# Patient Record
Sex: Female | Born: 1995 | Race: White | Hispanic: No | Marital: Single | Smoking: Former smoker
Health system: Southern US, Community
[De-identification: ages and names within clinical notes are randomized; demographics above are authoritative.]

## PROBLEM LIST (undated history)

## (undated) DIAGNOSIS — R55 Syncope and collapse: Secondary | ICD-10-CM

## (undated) DIAGNOSIS — R002 Palpitations: Secondary | ICD-10-CM

## (undated) DIAGNOSIS — R011 Cardiac murmur, unspecified: Secondary | ICD-10-CM

---
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
## 2015-04-10 ENCOUNTER — Encounter (HOSPITAL_COMMUNITY): Payer: Self-pay | Admitting: Emergency Medicine

## 2015-04-10 ENCOUNTER — Emergency Department (HOSPITAL_COMMUNITY)
Admission: EM | Admit: 2015-04-10 | Discharge: 2015-04-10 | Disposition: A | Discharge status: Home / Self Care | Payer: Self-pay | Attending: Emergency Medicine | Admitting: Emergency Medicine

## 2015-04-10 DIAGNOSIS — R55 Syncope and collapse: Secondary | ICD-10-CM | POA: Insufficient documentation

## 2015-04-10 DIAGNOSIS — R079 Chest pain, unspecified: Secondary | ICD-10-CM | POA: Insufficient documentation

## 2015-04-10 DIAGNOSIS — Z87891 Personal history of nicotine dependence: Secondary | ICD-10-CM | POA: Insufficient documentation

## 2015-04-10 DIAGNOSIS — R011 Cardiac murmur, unspecified: Secondary | ICD-10-CM | POA: Insufficient documentation

## 2015-04-10 DIAGNOSIS — R002 Palpitations: Secondary | ICD-10-CM | POA: Insufficient documentation

## 2015-04-10 HISTORY — DX: Palpitations: R00.2

## 2015-04-10 HISTORY — DX: Cardiac murmur, unspecified: R01.1

## 2015-04-10 HISTORY — DX: Syncope and collapse: R55

## 2015-04-10 LAB — BASIC METABOLIC PANEL
ANION GAP: 6 (ref 5–15)
BUN: 13 mg/dL (ref 6–20)
CHLORIDE: 105 mmol/L (ref 101–111)
CO2: 25 mmol/L (ref 22–32)
Calcium: 9 mg/dL (ref 8.9–10.3)
Creatinine, Ser: 0.58 mg/dL (ref 0.44–1.00)
GFR calc Af Amer: >60 mL/min (ref >60)
GFR calc non Af Amer: >60 mL/min (ref >60)
Glucose, Bld: 74 mg/dL (ref 65–99)
Potassium: 3.9 mmol/L (ref 3.5–5.1)
Sodium: 136 mmol/L (ref 135–145)

## 2015-04-10 LAB — CBC
HEMATOCRIT: 37.8 % (ref 36.0–46.0)
Hemoglobin: 12.3 g/dL (ref 12.0–15.0)
MCH: 29.5 pg (ref 26.0–34.0)
MCHC: 32.5 g/dL (ref 30.0–36.0)
MCV: 90.6 fL (ref 78.0–100.0)
Platelets: 212 10*3/uL (ref 150–400)
RBC: 4.17 MIL/uL (ref 3.87–5.11)
RDW: 13.3 % (ref 11.5–15.5)
WBC: 7.3 10*3/uL (ref 4.0–10.5)

## 2015-04-10 LAB — I-STAT TROPONIN, ED: TROPONIN I, POC: 0.01 ng/mL (ref 0.00–0.08)

## 2015-04-10 LAB — TSH: TSH: 0.75 u[IU]/mL (ref 0.350–4.500)

## 2015-04-10 NOTE — ED Provider Notes (Signed)
CSN: [id number]     Arrival date & time 04/10/15  1818 History   First MD Initiated Contact with Patient 04/10/15 1958     Chief Complaint  Patient presents with  . Dizziness  . Chest Pain     (Consider location/radiation/quality/duration/timing/severity/associated sxs/prior Treatment) HPI Comments: The pt is a 19 y/o female - she is otherwise healthy - has long time history of intermittent palpitations that occur 3X / year on average and are usually accompanied by a near syncopal episode - today was the same - she was at work as a Conservation officer, naturecashier and as she went to help someone check out she felt palpitations followed by a feeling of significant light headedness, this was persistent, and then resolved after a couple of minutes - she felt flushed in the face but was not diaphoretic, had no CP and no abd pain , no swelling.  She has not started any new medications, she thinks she may have had some recent unexpected weight loss but has had normal appetite and no diarrhea, dysuria, unusual bleeding, headaches, blurred vision, balance problems, numbness or weakness.  She has hx of seeing a doctor when she was younger b/c of chronic CP and palpitations - no definite diagnosis was made.  Patient is a 19 y.o. female presenting with dizziness and chest pain. The history is provided by the patient and a relative.  Dizziness Associated symptoms: chest pain   Chest Pain Associated symptoms: dizziness     Past Medical History  Diagnosis Date  . Heart murmur   . Near syncope   . Palpitations    History reviewed. No pertinent past surgical history. History reviewed. No pertinent family history. History  Substance Use Topics  . Smoking status: Former Smoker    Types: Cigarettes  . Smokeless tobacco: Not on file  . Alcohol Use: No   OB History    No data available     Review of Systems  Cardiovascular: Positive for chest pain.  Neurological: Positive for dizziness.  All other systems reviewed and  are negative.     Allergies  Review of patient's allergies indicates no known allergies.  Home Medications   Prior to Admission medications   Not on File   BP 134/70 mmHg  Pulse 78  Temp(Src) 99.3 F (37.4 C) (Oral)  Resp 18  SpO2 100% Physical Exam  Constitutional: She appears well-developed and well-nourished. No distress.  HENT:  Head: Normocephalic and atraumatic.  Mouth/Throat: Oropharynx is clear and moist. No oropharyngeal exudate.  Eyes: Conjunctivae and EOM are normal. Pupils are equal, round, and reactive to light. Right eye exhibits no discharge. Left eye exhibits no discharge. No scleral icterus.  Neck: Normal range of motion. Neck supple. No JVD present. No thyromegaly present.  Cardiovascular: Normal rate, regular rhythm, normal heart sounds and intact distal pulses.  Exam reveals no gallop and no friction rub.   No murmur heard. Pulmonary/Chest: Effort normal and breath sounds normal. No respiratory distress. She has no wheezes. She has no rales.  Abdominal: Soft. Bowel sounds are normal. She exhibits no distension and no mass. There is no tenderness.  Musculoskeletal: Normal range of motion. She exhibits no edema or tenderness.  Lymphadenopathy:    She has no cervical adenopathy.  Neurological: She is alert. Coordination normal.  Skin: Skin is warm and dry. No rash noted. No erythema.  Psychiatric: She has a normal mood and affect. Her behavior is normal.  Nursing note and vitals reviewed.   ED Course  Procedures (including critical care time) Labs Review Labs Reviewed  CBC  BASIC METABOLIC PANEL  TSH  I-STAT TROPOININ, ED    Imaging Review No results found.   EKG Interpretation   Date/Time:  Thursday April 10 2015 18:32:36 EDT Ventricular Rate:  70 PR Interval:  136 QRS Duration: 97 QT Interval:  382 QTC Calculation: 412 R Axis:   63 Text Interpretation:  Ectopic atrial rhythm RSR' in V1 or V2, right VCD or  RVH Borderline T  abnormalities, diffuse leads Abnormal ekg No old tracing  to compare Confirmed by Brylee Mcgreal  MD, Momo Braun ([id number]) on 04/10/2015 7:59:33 PM      MDM   Final diagnoses:  Palpitations  Near syncope    Normal exam, no distress, no arrhythmia, ECG with ectopic atrial rhythmi, no old to compare, check labs ad TSH, monitor.  Labs normal, ECG with ectopic atrial rhythm, pt informed of results - stable for d/c.  Cardiology referral given.  Eber Hong, MD 04/10/15 2137

## 2015-04-10 NOTE — ED Notes (Signed)
Pt presents to ED c/o previous near-syncopal episode at about 3pm this afternoon while at work that was accompanied by a "fluttering" sensation in her heart.  She is unsure whether or not she actually lost consciousness, but she does report that she felt very hot and went into "kind of a trance" and while she was able to see and recall what was happening, she could not respond. Pt then reported to the MD that she could not see what was happening. She states that since she was very young, she can recall having episodes of palpitations but has only been diagnosed with a heart murmur.  She does not have a documented history of syncope, seizures, or any cardiac conditions. Pt denies current pain, nausea, vomiting, dizziness, palpitations, or other symptoms.  She states that she is currently hoping to be evaluated and possibly referred to neurology or cardiology.

## 2015-04-10 NOTE — ED Notes (Signed)
Pt c/o the sensation of intermittent heart palpitation and intermittent dizziness since she was 19 years old, chest tightness x 2 years. Pt states that today she felt dizzy today, pt managed to grab onto something and not have syncopal episode and not fall. Pt denies any new problems, states that she does not have medical insurance and needs to be evaluated.

## 2015-06-11 ENCOUNTER — Encounter: Payer: Self-pay | Admitting: Cardiology

## 2015-06-11 ENCOUNTER — Ambulatory Visit (INDEPENDENT_AMBULATORY_CARE_PROVIDER_SITE_OTHER): Payer: Self-pay | Admitting: Cardiology

## 2015-06-11 ENCOUNTER — Encounter: Payer: Self-pay | Admitting: Cardiovascular Disease

## 2015-06-11 VITALS — BP 112/62 | HR 62 | Ht 67.0 in | Wt 132.4 lb

## 2015-06-11 DIAGNOSIS — I951 Orthostatic hypotension: Secondary | ICD-10-CM

## 2015-06-11 DIAGNOSIS — R002 Palpitations: Secondary | ICD-10-CM

## 2015-06-11 DIAGNOSIS — R011 Cardiac murmur, unspecified: Secondary | ICD-10-CM

## 2015-06-11 DIAGNOSIS — R55 Syncope and collapse: Secondary | ICD-10-CM

## 2015-06-11 NOTE — Patient Instructions (Signed)
DRINK ABOUT 12 TO 14  (8 oz of water a day) your urine should be clear when you go to the bathroom.   SCHEDULE WITH DR [facility] physician has recommended that you have a tilt table test. This test is sometimes used to help determine the cause of fainting spells. You lie on a table that moves from a lying down to an upright position. The change in position can bring on loss of consciousness. The doctor monitors your symptoms, heart rate, EKG, and blood pressure throughout the test. The doctor also may give you a medicine and then monitor your response to the medicine. This is done in the hospital and usually takes half of a day to complete the procedure. Please see the instruction sheet given to you today for more information.  SCHEDULE AT [address]  [address] -  [facility] OFFICEYour physician has requested that you have an echocardiogram. Echocardiography is a painless test that uses sound waves to create images of your heart. It provides your doctor with information about the size and shape of your heart and how well your heart's chambers and valves are working. This procedure takes approximately one hour. There are no restrictions for this procedure.  [employer] WILL MAIL YOU A MONITOR . YOU WILL WEAR IT FOR 30 DAYS AND THEN RETURN TO [employer]. Your physician has recommended that you wear an event monitor. Event monitors are medical devices that record the heart's electrical activity. Doctors most often [place] these monitors to diagnose arrhythmias. Arrhythmias are problems with the speed or rhythm of the heartbeat. The monitor is a small, portable device. You can wear one while you do your normal daily activities. This is usually used to diagnose what is causing palpitations/syncope (passing out).  Your physician recommends that you schedule a follow-up appointment in 2 MONTHS WITH DR HARDING -30 MIN APPOINTMENT.   Cardiac Event Monitoring A cardiac event monitor is a small recording  device used to help detect abnormal heart rhythms (arrhythmias). The monitor is used to record heart rhythm when noticeable symptoms such as the following occur:  Fast heartbeats (palpitations), such as heart racing or fluttering.  Dizziness.  Fainting or light-headedness.  Unexplained weakness. The monitor is wired to two electrodes placed on your chest. Electrodes are flat, sticky disks that attach to your skin. The monitor can be worn for up to 30 days. You will wear the monitor at all times, except when bathing.  HOW TO USE YOUR CARDIAC EVENT MONITOR A technician will prepare your chest for the electrode placement. The technician will show you how to place the electrodes, how to work the monitor, and how to replace the batteries. Take time to practice using the monitor before you leave the office. Make sure you understand how to send the information from the monitor to your health care provider. This requires a telephone with a landline, not a cell phone. You need to:  Wear your monitor at all times, except when you are in water:  Do not get the monitor wet.  Take the monitor off when bathing. Do not swim or use a hot tub with it on.  Keep your skin clean. Do not put body lotion or moisturizer on your chest.  Change the electrodes daily or any time they stop sticking to your skin. You might need to use tape to keep them on.  It is possible that your skin under the electrodes could become irritated. To keep this from happening, try to put  the electrodes in slightly different places on your chest. However, they must remain in the area under your left breast and in the upper right section of your chest.  Make sure the monitor is safely clipped to your clothing or in a location close to your body that your health care provider recommends.  Press the button to record when you feel symptoms of heart trouble, such as dizziness, weakness, light-headedness, palpitations, thumping, shortness of  breath, unexplained weakness, or a fluttering or racing heart. The monitor is always on and records what happened slightly before you pressed the button, so do not worry about being too late to get good information.  Keep a diary of your activities, such as walking, doing chores, and taking medicine. It is especially important to note what you were doing when you pushed the button to record your symptoms. This will help your health care provider determine what might be contributing to your symptoms. The information stored in your monitor will be reviewed by your health care provider alongside your diary entries.  Send the recorded information as recommended by your health care provider. It is important to understand that it will take some time for your health care provider to process the results.  Change the batteries as recommended by your health care provider. SEEK IMMEDIATE MEDICAL CARE IF:   You have chest pain.  You have extreme difficulty breathing or shortness of breath.  You develop a very fast heartbeat that persists.  You develop dizziness that does not go away.  You faint or constantly feel you are about to faint. Document Released: 08/03/2008 Document Revised: 03/11/2014 Document Reviewed: 04/23/2013 [facility] Patient Information 2015 [place], [place]. This information is not intended to replace advice given to you by your health care provider. Make sure you discuss any questions you have with your health care provider.

## 2015-06-13 DIAGNOSIS — R55 Syncope and collapse: Secondary | ICD-10-CM | POA: Insufficient documentation

## 2015-06-13 DIAGNOSIS — R002 Palpitations: Secondary | ICD-10-CM | POA: Insufficient documentation

## 2015-06-13 DIAGNOSIS — I951 Orthostatic hypotension: Secondary | ICD-10-CM | POA: Insufficient documentation

## 2015-06-13 DIAGNOSIS — R011 Cardiac murmur, unspecified: Secondary | ICD-10-CM | POA: Insufficient documentation

## 2015-06-13 NOTE — Progress Notes (Signed)
PATIENT: Misty Guerra MRN: [id number] DOB: Mar 05, 1996 PCP: No PCP Per Patient  Clinic Note: Chief Complaint  Patient presents with  . New Evaluation     chest pain-tightness, no  shortness of breath, no edema, has frequent pain in legs a lot, frequently has cramping in legs, frequent lightheadedness, frequent dizziness    HPI: Misty Guerra is a 19 y.o. female with a PMH below who presents today for evaluation of syncopal episodes and palpitations.  Interval History: She is a very anxious appearing young woman who was referred here in follow-up for recent ER visit following an syncopal episode. She has had a history of passing out spells before as a teenager. Initially these were thought to potentially be seizures. Tells a history of having passed out spells at the site of blockage or pain or stressful situations.  Her recent episode is actually a recurrence of other episodes of syncope that all seemed to have gotten worse ever since she has been trying to stabilize her relationship with her child's father. The episode that caused her to be referred here to me occurred on June 2. Is apparently her third time this past few months. 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 She began to her and get lightheaded and clammy. Although she may pass out but did not fully pass out. She then had another episode where she actually did apparently pass out.  She otherwise denies any significant significant rapid irregular heartbeats. Feels a tightness in her chest and some dyspnea while she feels that she is about to pass out but otherwise does not. None of this is exertional in nature. She does not drink a significant amount of caffeine but also does not drink a significant amount of hydration with water or non-caffeinated beverages. She describes an episode at 19 years old passing out. Then one time was when she had a tooth pulled and another time when she  had a small surgical procedure done.  The remainder of cardiac review of systems is as follows: Cardiovascular ROS: positive for - loss of consciousness and Associated with palpitations and dyspnea as well as some chest discomfort negative for - edema, murmur, orthopnea, paroxysmal nocturnal dyspnea, shortness of breath or TIA/amaurosis fugax :   Past Medical History  Diagnosis Date  . Heart murmur   . Near syncope   . Palpitations     Prior Cardiac Evaluation and Past Surgical History: No past surgical history on file.  No Known Allergies  No current outpatient prescriptions on file.   No current facility-administered medications for this visit.    History  Substance Use Topics  . Smoking status: Former Smoker    Types: Cigarettes  . Smokeless tobacco: Not on file  . Alcohol Use: No    family history includes Depression in her mother; Heart attack in her paternal grandfather.  ROS: A comprehensive Review of Systems - was performed Review of Systems  Constitutional: Positive for diaphoresis (With her near syncopal events).  Respiratory: Positive for shortness of breath (Only with near syncope or syncope). Negative for cough.   Cardiovascular: Positive for palpitations. Negative for claudication and leg swelling.  Gastrointestinal: Negative for blood in stool and melena.  Genitourinary: Negative for hematuria.  Musculoskeletal:       Bilateral leg pains frequently with cramping.  Neurological: Positive for dizziness and loss of consciousness. Negative for weakness.  Endo/Heme/Allergies: Does not bruise/bleed easily.  Psychiatric/Behavioral: Positive for  depression. The patient is nervous/anxious.        Very tearful  All other systems reviewed and are negative.   PHYSICAL EXAM BP 112/62 mmHg  Pulse 62  Ht 5\' 7"  (1.702 m)  Wt 132 lb 6 oz (60.045 kg)  BMI 20.73 kg/m2  LMP 05/20/2015 General appearance: alert, cooperative, appears stated age, no distress and  Healthy-appearing. Well-nourished and well-groomed Neck: no adenopathy, no carotid bruit, no JVD, supple, symmetrical, trachea midline and thyroid not enlarged, symmetric, no tenderness/mass/nodules Lungs: clear to auscultation bilaterally, normal percussion bilaterally and Nonlabored, good air movement Heart: RRR with normal S1 and S2. Cannot exclude soft midsystolic click. Otherwise no M/R/G. Nondisplaced PMI. Abdomen: soft, non-tender; bowel sounds normal; no masses,  no organomegaly Extremities: extremities normal, atraumatic, no cyanosis or edema and No acrocyanosis noted with wall lean Pulses: 2+ and symmetric Skin: Skin color, texture, turgor normal. No rashes or lesions or Several tattoos Neurologic: Mental status: Alert, oriented, thought content appropriate, affect: labile Cranial nerves: normal   Adult ECG Report  Rate: 62 ;  Rhythm: normal sinus rhythm and Nonspecific ST-T wave changes with T-wave inversions in lead 3 and aVF as well as V3.  Narrative Interpretation: Abnormal EKG with nonspecific T-wave changes.  Recent Labs:    Chemistry      Component Value Date/Time   NA 136 04/10/2015 1953   K 3.9 04/10/2015 1953   CL 105 04/10/2015 1953   CO2 25 04/10/2015 1953   BUN 13 04/10/2015 1953   CREATININE 0.58 04/10/2015 1953      Component Value Date/Time   CALCIUM 9.0 04/10/2015 1953      ASSESSMENT / PLAN: Very pleasant young woman with a history of syncope and frequent near syncope. She has some symptoms of orthostatic hypotension and dizziness but also symptoms to suggest vasovagal syncope. There is also possible slight cardiac murmur versus an systolic click. Cannot exclude mitral prolapse given her palpitations. Cannot exclude arrhythmia since her symptoms are often associated with palpitations.  In order to clarify presence or absence of any structural other maladies we will check an echocardiogram. As a strongly believe her symptoms are related to vasovagal  syncope, but also potentially related to orthostatics, I would order a tilt table test. To evaluate for any arrhythmias or cardiac event monitor - I have asked that she continue with her general routine life activities while wearing the monitor.  More likely of the symptoms are probably related to increased levels of social stress and anxiety. I think if there is a vasovagal or neurocardiogenic component to her syncope, I think SSRI may be a very good option for her and also treat her anxiety disorder.  Problem List Items Addressed This Visit    None    Visit Diagnoses    Syncope, unspecified syncope type    -  Primary    Relevant Orders    EKG 12-Lead (Completed)    TILT TABLE STUDY    ECHOCARDIOGRAM COMPLETE    Cardiac event monitor    Orthostatic hypotension        Relevant Orders    EKG 12-Lead (Completed)    TILT TABLE STUDY    ECHOCARDIOGRAM COMPLETE    Cardiac event monitor    Cardiac murmur        Relevant Orders    EKG 12-Lead (Completed)    TILT TABLE STUDY    ECHOCARDIOGRAM COMPLETE    Cardiac event monitor    Palpitations  Relevant Orders    EKG 12-Lead (Completed)    TILT TABLE STUDY    ECHOCARDIOGRAM COMPLETE    Cardiac event monitor       No orders of the defined types were placed in this encounter.    Followup: 2 months  Dellis Voght W. Herbie [place], M.D., M.S. Interventional Cardiolgy [facility]

## 2015-06-19 ENCOUNTER — Telehealth: Payer: Self-pay | Admitting: Cardiology

## 2015-06-19 NOTE — Telephone Encounter (Signed)
[employer] called - they have no insurance on file for patient - attempted to contact patient to offer self-pay - she states phone number inactive  I attempted to reach patient - number we have on file is same [employer] has - did not work for me either.  Routing to [place] for additional advice

## 2015-06-23 ENCOUNTER — Other Ambulatory Visit: Payer: Self-pay | Admitting: *Deleted

## 2015-06-23 DIAGNOSIS — Z01818 Encounter for other preprocedural examination: Secondary | ICD-10-CM

## 2015-07-01 ENCOUNTER — Telehealth: Payer: Self-pay | Admitting: Cardiology

## 2015-07-01 ENCOUNTER — Other Ambulatory Visit (HOSPITAL_COMMUNITY): Payer: Self-pay

## 2015-07-01 NOTE — Telephone Encounter (Signed)
Patient was a no show for echo which was schedule for 7:30 on today.  Patient no showed for the test and was unable to be reach by phone because the number was not working.  Ms. Giacomo was  to have a 30 day event monitor today and the appointment was cancel due she is self pay.  She was going  to received the application for [employer] financial hardship program at her appointment.

## 2015-07-05 ENCOUNTER — Encounter (HOSPITAL_COMMUNITY): Payer: Self-pay | Admitting: Anesthesiology

## 2015-07-08 ENCOUNTER — Ambulatory Visit (HOSPITAL_COMMUNITY): Admission: RE | Admit: 2015-07-08 | Payer: Self-pay | Source: Ambulatory Visit | Admitting: Cardiovascular Disease

## 2015-07-08 ENCOUNTER — Encounter (HOSPITAL_COMMUNITY): Admission: RE | Payer: Self-pay | Source: Ambulatory Visit

## 2015-07-08 SURGERY — TILT TABLE STUDY

## 2015-07-17 ENCOUNTER — Telehealth: Payer: Self-pay | Admitting: Cardiology

## 2015-07-17 NOTE — Telephone Encounter (Signed)
Pt says she been waiting for a monitor for over a month,she still have not received it.

## 2015-07-17 NOTE — Telephone Encounter (Signed)
Left message for patient to call back. I had attempted to contact patient 1 month ago following an attempt by [employer] representatives to get in touch with patient. Initially, she was sent home w/ [employer] monitoring - unsure what happened to this, at time, patient was self-pay, she also did not have an operating phone number or confirmed home address. Since then, appears she was a no show for an echo - would have had a [employer] monitor set up at that time. She still may need to submit application to [employer] for self-pay assistance - will need to confirm this upon callback.

## 2015-07-21 NOTE — Telephone Encounter (Signed)
Pt returned call. Supposed to be set up for [employer] recently (same day as echo). She was no-show for echo - needs to be rescheduled. Also needs [employer] self-pay assistance. Instructed that we will get echo scheduled, have her come in and fill out form for [employer] when she comes to [facility].  Messages sent to [facility] for echo scheduling, and to Andee Lineman for monitor scheduling.

## 2015-08-25 ENCOUNTER — Ambulatory Visit: Payer: Self-pay | Admitting: Cardiology

## 2015-08-27 ENCOUNTER — Encounter: Payer: Self-pay | Admitting: Cardiology

## 2015-08-27 NOTE — Progress Notes (Signed)
  ROS  This encounter was created in error - please disregard. 

## 2015-08-31 ENCOUNTER — Emergency Department (HOSPITAL_COMMUNITY): Payer: Self-pay

## 2015-08-31 ENCOUNTER — Encounter (HOSPITAL_COMMUNITY): Payer: Self-pay | Admitting: Nurse Practitioner

## 2015-08-31 ENCOUNTER — Other Ambulatory Visit: Payer: Self-pay

## 2015-08-31 ENCOUNTER — Emergency Department (HOSPITAL_COMMUNITY)
Admission: EM | Admit: 2015-08-31 | Discharge: 2015-08-31 | Disposition: A | Discharge status: Home / Self Care | Payer: Self-pay | Attending: Emergency Medicine | Admitting: Emergency Medicine

## 2015-08-31 DIAGNOSIS — R0789 Other chest pain: Secondary | ICD-10-CM | POA: Insufficient documentation

## 2015-08-31 DIAGNOSIS — F419 Anxiety disorder, unspecified: Secondary | ICD-10-CM | POA: Insufficient documentation

## 2015-08-31 DIAGNOSIS — G8929 Other chronic pain: Secondary | ICD-10-CM | POA: Insufficient documentation

## 2015-08-31 DIAGNOSIS — Z87891 Personal history of nicotine dependence: Secondary | ICD-10-CM | POA: Insufficient documentation

## 2015-08-31 DIAGNOSIS — R5383 Other fatigue: Secondary | ICD-10-CM | POA: Insufficient documentation

## 2015-08-31 DIAGNOSIS — Z3202 Encounter for pregnancy test, result negative: Secondary | ICD-10-CM | POA: Insufficient documentation

## 2015-08-31 DIAGNOSIS — R5382 Chronic fatigue, unspecified: Secondary | ICD-10-CM

## 2015-08-31 DIAGNOSIS — R011 Cardiac murmur, unspecified: Secondary | ICD-10-CM | POA: Insufficient documentation

## 2015-08-31 LAB — BASIC METABOLIC PANEL
Anion gap: 5 (ref 5–15)
BUN: 11 mg/dL (ref 6–20)
CHLORIDE: 106 mmol/L (ref 101–111)
CO2: 25 mmol/L (ref 22–32)
Calcium: 8.6 mg/dL — ABNORMAL LOW (ref 8.9–10.3)
Creatinine, Ser: 0.65 mg/dL (ref 0.44–1.00)
GFR calc Af Amer: >60 mL/min (ref >60)
GFR calc non Af Amer: >60 mL/min (ref >60)
Glucose, Bld: 91 mg/dL (ref 65–99)
POTASSIUM: 3.8 mmol/L (ref 3.5–5.1)
SODIUM: 136 mmol/L (ref 135–145)

## 2015-08-31 LAB — CBC WITH DIFFERENTIAL/PLATELET
BASOS PCT: 0 %
Basophils Absolute: 0 10*3/uL (ref 0.0–0.1)
Eosinophils Absolute: 0.2 10*3/uL (ref 0.0–0.7)
Eosinophils Relative: 4 %
HCT: 35.1 % — ABNORMAL LOW (ref 36.0–46.0)
Hemoglobin: 11.5 g/dL — ABNORMAL LOW (ref 12.0–15.0)
LYMPHS PCT: 45 %
Lymphs Abs: 2.4 10*3/uL (ref 0.7–4.0)
MCH: 30.1 pg (ref 26.0–34.0)
MCHC: 32.8 g/dL (ref 30.0–36.0)
MCV: 91.9 fL (ref 78.0–100.0)
Monocytes Absolute: 0.5 10*3/uL (ref 0.1–1.0)
Monocytes Relative: 9 %
NEUTROS PCT: 42 %
Neutro Abs: 2.3 10*3/uL (ref 1.7–7.7)
Platelets: 200 10*3/uL (ref 150–400)
RBC: 3.82 MIL/uL — AB (ref 3.87–5.11)
RDW: 13.9 % (ref 11.5–15.5)
WBC: 5.4 10*3/uL (ref 4.0–10.5)

## 2015-08-31 LAB — I-STAT BETA HCG BLOOD, ED (MC, WL, AP ONLY): I-stat hCG, quantitative: <5 m[IU]/mL (ref <5)

## 2015-08-31 LAB — I-STAT TROPONIN, ED: TROPONIN I, POC: 0.01 ng/mL (ref 0.00–0.08)

## 2015-08-31 LAB — I-STAT CG4 LACTIC ACID, ED: Lactic Acid, Venous: 0.57 mmol/L (ref 0.5–2.0)

## 2015-08-31 MED ORDER — ASPIRIN 325 MG PO TABS
325.0000 mg | ORAL_TABLET | Freq: Once | ORAL | Status: AC
  Administered 2015-08-31: 325 mg via ORAL
  Filled 2015-08-31: qty 1

## 2015-08-31 MED ORDER — SODIUM CHLORIDE 0.9 % IV BOLUS (SEPSIS)
1000.0000 mL | Freq: Once | INTRAVENOUS | Status: AC
  Administered 2015-08-31: 1000 mL via INTRAVENOUS

## 2015-08-31 NOTE — ED Provider Notes (Signed)
CSN: [id number]     Arrival date & time 08/31/15  1643 History   First MD Initiated Contact with Patient 08/31/15 1653     Chief Complaint  Patient presents with  . Chest Pain  . Weakness     (Consider location/radiation/quality/duration/timing/severity/associated sxs/prior Treatment) HPI Comments: Misty Guerra is a 19 y.o. female with a PMHx of heart murmur, nearsyncope, chronic chest pain, and palpitations, who presents to the ED with complaints of chest tightness 2-3 days and general fatigue. She reports this is the same as her recurrent chest tightness she has had in the past, she has been seen by Dr. Herbie Baltimore of cardiology who has ordered an echo and tilt table test but she has not yet done these. She describes her chest tightness as 5/10 intermittent left anterior chest tightness which is nonradiating worse with activity and with no treatments tried prior to arrival. She reports that she gets anxious when this occurs. She denies any fevers, chills, URI symptoms, cough, shortness of breath, diaphoresis, lightheadedness, leg swelling, recent travel/surgery/immobilization, OCPs, history of DVT/PE, dental pain, nausea, vomiting, diarrhea, constipation, dysuria, hematuria, numbness, tingling, weakness, claudication, or orthopnea. She is a nonsmoker. She has a family history of MI in her paternal grandfather but no other cardiac disease in her family.  Patient is a 19 y.o. female presenting with chest pain. The history is provided by the patient. No language interpreter was used.  Chest Pain Pain location:  L chest Pain quality: tightness   Pain radiates to:  Does not radiate Pain radiates to the back: no   Pain severity:  Mild Onset quality:  Gradual Duration:  3 days Timing:  Intermittent Progression:  Unchanged Chronicity:  Recurrent Context: at rest   Relieved by:  None tried Exacerbated by: activity. Ineffective treatments:  None tried Associated symptoms: anxiety and fatigue    Associated symptoms: no abdominal pain, no claudication, no cough, no diaphoresis, no fever, no lower extremity edema, no nausea, no near-syncope, no numbness, no orthopnea, no shortness of breath, no syncope, not vomiting and no weakness   Risk factors: no birth control, no coronary artery disease, no diabetes mellitus, no high cholesterol, no hypertension, no immobilization, no prior DVT/PE, no smoking and no surgery     Past Medical History  Diagnosis Date  . Heart murmur   . Near syncope   . Palpitations    No past surgical history on file. Family History  Problem Relation Age of Onset  . Depression Mother   . Heart attack Paternal Grandfather    Social History  Substance Use Topics  . Smoking status: Former Smoker    Types: Cigarettes  . Smokeless tobacco: Not on file  . Alcohol Use: No   OB History    No data available     Review of Systems  Constitutional: Positive for fatigue. Negative for fever, chills and diaphoresis.  HENT: Negative for rhinorrhea and sore throat.   Respiratory: Positive for chest tightness. Negative for cough and shortness of breath.   Cardiovascular: Positive for chest pain (tightness). Negative for orthopnea, claudication, leg swelling, syncope and near-syncope.  Gastrointestinal: Negative for nausea, vomiting, abdominal pain, diarrhea and constipation.  Genitourinary: Negative for dysuria and hematuria.  Musculoskeletal: Negative for myalgias and arthralgias.  Skin: Negative for color change.  Allergic/Immunologic: Negative for immunocompromised state.  Neurological: Negative for syncope, weakness, light-headedness and numbness.  Psychiatric/Behavioral: Negative for confusion.   10 Systems reviewed and are negative for acute change except as noted in  the HPI.    Allergies  Review of patient's allergies indicates no known allergies.  Home Medications   Prior to Admission medications   Not on File   BP 114/75 mmHg  Pulse 63   Temp(Src) 98.4 F (36.9 C) (Oral)  Resp 21  Ht 5\' 9"  (1.753 m)  Wt 140 lb (63.504 kg)  BMI 20.67 kg/m2  SpO2 100% Physical Exam  Constitutional: She is oriented to person, place, and time. Vital signs are normal. She appears well-developed and well-nourished.  Non-toxic appearance. No distress.  Afebrile, nontoxic, NAD  HENT:  Head: Normocephalic and atraumatic.  Mouth/Throat: Oropharynx is clear and moist and mucous membranes are normal.  Eyes: Conjunctivae and EOM are normal. Right eye exhibits no discharge. Left eye exhibits no discharge.  Neck: Normal range of motion. Neck supple.  Cardiovascular: Normal rate, regular rhythm, S1 normal, S2 normal and intact distal pulses.  Exam reveals no gallop and no friction rub.   Murmur heard.  Systolic murmur is present  RRR, nl s1/s2, ?midsystolic click (faint), distal pulses intact, no pedal edema   Pulmonary/Chest: Effort normal and breath sounds normal. No respiratory distress. She has no decreased breath sounds. She has no wheezes. She has no rhonchi. She has no rales.  Abdominal: Soft. Normal appearance and bowel sounds are normal. She exhibits no distension. There is no tenderness. There is no rigidity, no rebound, no guarding, no CVA tenderness, no tenderness at McBurney's point and negative Murphy's sign.  Musculoskeletal: Normal range of motion.  MAE x4 Strength and sensation grossly intact Distal pulses intact No pedal edema, neg homan's bilaterally   Neurological: She is alert and oriented to person, place, and time. She has normal strength. No cranial nerve deficit or sensory deficit. Gait normal. GCS eye subscore is 4. GCS verbal subscore is 5. GCS motor subscore is 6.  No focal neuro deficits  Skin: Skin is warm, dry and intact. No rash noted.  Psychiatric: She has a normal mood and affect.  Nursing note and vitals reviewed.   ED Course  Procedures (including critical care time) Labs Review Labs Reviewed  BASIC METABOLIC  PANEL - Abnormal; Notable for the following:    Calcium 8.6 (*)    All other components within normal limits  CBC WITH DIFFERENTIAL/PLATELET - Abnormal; Notable for the following:    RBC 3.82 (*)    Hemoglobin 11.5 (*)    HCT 35.1 (*)    All other components within normal limits  I-STAT TROPOININ, ED  I-STAT CG4 LACTIC ACID, ED  I-STAT BETA HCG BLOOD, ED ([facility], [facility], [facility] ONLY)  I-STAT BETA HCG BLOOD, ED ([facility], [facility], [facility] ONLY)    Imaging Review Dg Chest 2 View  08/31/2015  CLINICAL DATA:  Chest tightness x3 days EXAM: CHEST  2 VIEW COMPARISON:  None. FINDINGS: Lungs are clear.  No pleural effusion or pneumothorax. The heart is normal in size. Visualized osseous structures are within normal limits. IMPRESSION: Normal chest radiographs. Electronically Signed   By: Charline BillsSriyesh  Krishnan M.D.   On: 08/31/2015 17:39   I have personally reviewed and evaluated these images and lab results as part of my medical decision-making.   EKG Interpretation None      MDM   Final diagnoses:  Chest tightness  Chronic fatigue    19 y.o. female here with chest tightness and fatigue x3 days. States this is similar to prior episodes of chest tightness that she has experienced since she was a teen. Has seen Dr.  Herbie Baltimore of cardiology in August but hasn't followed up with echo or tilt table test. On exam, ?midsystolic click auscultated, chest wall TTP which could mean pain is musculoskeletal. No tachycardia, hypoxia, or LE swelling. No focal neuro findings. Will get basic labs, EKG, CXR, lactic, and give ASA and fluids. Will reassess shortly.   7:16 PM EKG unchanged from prior, showing ectopic atrial  Rhythm. CXR clear. Labs unremarkable aside from mildly low H/H similar to prior. Will have pt f/up with cardiology this week. Tylenol/motrin for pain, since ASA seemed to help which could indicate inflammatory/MsK etiology. Stay well hydrated and get plenty of rest. I explained the diagnosis and have given explicit  precautions to return to the ER including for any other new or worsening symptoms. The patient understands and accepts the medical plan as it's been dictated and I have answered their questions. Discharge instructions concerning home care and prescriptions have been given. The patient is STABLE and is discharged to home in good condition.  BP 114/75 mmHg  Pulse 63  Temp(Src) 98.4 F (36.9 C) (Oral)  Resp 21  Ht  (1.753 m)  Wt 140 lb (63.504 kg)  BMI 20.67 kg/m2  SpO2 100%  LMP 08/17/2015 (Approximate)  Meds ordered this encounter  Medications  . sodium chloride 0.9 % bolus 1,000 mL    Sig:   . aspirin tablet 325 mg    Sig:      Belinda Bringhurst Camprubi-Soms, PA-C 08/31/15 1918  Glynn Octave, MD 08/31/15 2257

## 2015-08-31 NOTE — ED Notes (Signed)
Pt c/o chest pain that she describes as tightness, general malaise and weakness, onset 3-7 days ago, chart reviews indicates recent cardiac evaluation and a heart murmurs. Denies shortness of breath or any other symptoms.

## 2015-08-31 NOTE — Discharge Instructions (Signed)
Follow up with your cardiologist this week for ongoing management of your symptoms. Stay well hydrated, get plenty of rest. Use tylenol or motrin as needed for pain. Return to the ER for changes or worsening symptoms.   Chest Wall Pain Chest wall pain is pain in or around the bones and muscles of your chest. Sometimes, an injury causes this pain. Sometimes, the cause may not be known. This pain may take several weeks or longer to get better. HOME CARE Pay attention to any changes in your symptoms. Take these actions to help with your pain:  Rest as told by your doctor.  Avoid activities that cause pain. Try not to use your chest, belly (abdominal), or side muscles to lift heavy things.  If directed, apply ice to the painful area:  Put ice in a plastic bag.  Place a towel between your skin and the bag.  Leave the ice on for 20 minutes, 2-3 times per day.  Take over-the-counter and prescription medicines only as told by your doctor.  Do not use tobacco products, including cigarettes, chewing tobacco, and e-cigarettes. If you need help quitting, ask your doctor.  Keep all follow-up visits as told by your doctor. This is important. GET HELP IF:  You have a fever.  Your chest pain gets worse.  You have new symptoms. GET HELP RIGHT AWAY IF:  You feel sick to your stomach (nauseous) or you throw up (vomit).  You feel sweaty or light-headed.  You have a cough with phlegm (sputum) or you cough up blood.  You are short of breath.   This information is not intended to replace advice given to you by your health care provider. Make sure you discuss any questions you have with your health care provider.   Document Released: 04/12/2008 Document Revised: 07/16/2015 Document Reviewed: 01/20/2015 Elsevier Interactive Patient Education 2016 [employer].   Fatigue Fatigue is feeling tired all of the time, a lack of energy, or a lack of motivation. Occasional or mild fatigue is often a  normal response to activity or life in general. However, long-lasting (chronic) or extreme fatigue may indicate an underlying medical condition. HOME CARE INSTRUCTIONS  Watch your fatigue for any changes. The following actions may help to lessen any discomfort you are feeling:  Talk to your health care provider about how much sleep you need each night. Try to get the required amount every night.  Take medicines only as directed by your health care provider.  Eat a healthy and nutritious diet. Ask your health care provider if you need help changing your diet.  Drink enough fluid to keep your urine clear or pale yellow.  Practice ways of relaxing, such as yoga, meditation, massage therapy, or acupuncture.  Exercise regularly.   Change situations that cause you stress. Try to keep your work and personal routine reasonable.  Do not abuse illegal drugs.  Limit alcohol intake to no more than 1 drink per day for nonpregnant women and 2 drinks per day for men. One drink equals 12 ounces of beer, 5 ounces of wine, or 1 ounces of hard liquor.  Take a multivitamin, if directed by your health care provider. SEEK MEDICAL CARE IF:   Your fatigue does not get better.  You have a fever.   You have unintentional weight loss or gain.  You have headaches.   You have difficulty:   Falling asleep.  Sleeping throughout the night.  You feel angry, guilty, anxious, or sad.   You  are unable to have a bowel movement (constipation).   You skin is dry.   Your legs or another part of your body is swollen.  SEEK IMMEDIATE MEDICAL CARE IF:   You feel confused.   Your vision is blurry.  You feel faint or pass out.   You have a severe headache.   You have severe abdominal, pelvic, or back pain.   You have chest pain, shortness of breath, or an irregular or fast heartbeat.   You are unable to urinate or you urinate less than normal.   You develop abnormal bleeding, such  as bleeding from the rectum, vagina, nose, lungs, or nipples.  You vomit blood.   You have thoughts about harming yourself or committing suicide.   You are worried that you might harm someone else.    This information is not intended to replace advice given to you by your health care provider. Make sure you discuss any questions you have with your health care provider.   Document Released: 08/22/2007 Document Revised: 11/15/2014 Document Reviewed: 02/26/2014 Elsevier Interactive Patient Education 2016 Elsevier Inc.  Nonspecific Chest Pain It is often hard to find the cause of chest pain. There is always a chance that your pain could be related to something serious, such as a heart attack or a blood clot in your lungs. Chest pain can also be caused by conditions that are not life-threatening. If you have chest pain, it is very important to follow up with your doctor.  HOME CARE  If you were prescribed an antibiotic medicine, finish it all even if you start to feel better.  Avoid any activities that cause chest pain.  Do not use any tobacco products, including cigarettes, chewing tobacco, or electronic cigarettes. If you need help quitting, ask your doctor.  Do not drink alcohol.  Take medicines only as told by your doctor.  Keep all follow-up visits as told by your doctor. This is important. This includes any further testing if your chest pain does not go away.  Your doctor may tell you to keep your head raised (elevated) while you sleep.  Make lifestyle changes as told by your doctor. These may include:  Getting regular exercise. Ask your doctor to suggest some activities that are safe for you.  Eating a heart-healthy diet. Your doctor or a diet specialist (dietitian) can help you to learn healthy eating options.  Maintaining a healthy weight.  Managing diabetes, if necessary.  Reducing stress. GET HELP IF:  Your chest pain does not go away, even after  treatment.  You have a rash with blisters on your chest.  You have a fever. GET HELP RIGHT AWAY IF:  Your chest pain is worse.  You have an increasing cough, or you cough up blood.  You have severe belly (abdominal) pain.  You feel extremely weak.  You pass out (faint).  You have chills.  You have sudden, unexplained chest discomfort.  You have sudden, unexplained discomfort in your arms, back, neck, or jaw.  You have shortness of breath at any time.  You suddenly start to sweat, or your skin gets clammy.  You feel nauseous.  You vomit.  You suddenly feel light-headed or dizzy.  Your heart begins to beat quickly, or it feels like it is skipping beats. These symptoms may be an emergency. Do not wait to see if the symptoms will go away. Get medical help right away. Call your local emergency services (911 in the [place] Do not drive  yourself to the hospital.   This information is not intended to replace advice given to you by your health care provider. Make sure you discuss any questions you have with your health care provider.   Document Released: 04/12/2008 Document Revised: 11/15/2014 Document Reviewed: 05/31/2014 Elsevier Interactive Patient Education [employer] Elsevier Inc.

## 2015-10-14 ENCOUNTER — Ambulatory Visit (INDEPENDENT_AMBULATORY_CARE_PROVIDER_SITE_OTHER): Payer: Self-pay | Admitting: Cardiology

## 2015-10-14 VITALS — BP 110/60 | HR 59 | Ht 67.0 in | Wt 128.0 lb

## 2015-10-14 DIAGNOSIS — I951 Orthostatic hypotension: Secondary | ICD-10-CM

## 2015-10-14 DIAGNOSIS — R002 Palpitations: Secondary | ICD-10-CM

## 2015-10-14 DIAGNOSIS — R011 Cardiac murmur, unspecified: Secondary | ICD-10-CM

## 2015-10-14 DIAGNOSIS — R55 Syncope and collapse: Secondary | ICD-10-CM

## 2015-10-14 DIAGNOSIS — R079 Chest pain, unspecified: Secondary | ICD-10-CM

## 2015-10-14 NOTE — Patient Instructions (Addendum)
Your physician has requested that you have an exercise tolerance test. For further information please visit [email or web address]. Please also follow instruction sheet, as given.   Your physician has recommended that you wear a holter monitor  48 HOURS  TO [address]. Holter monitors are medical devices that record the heart's electrical activity. Doctors most often use these monitors to diagnose arrhythmias. Arrhythmias are problems with the speed or rhythm of the heartbeat. The monitor is a small, portable device. You can wear one while you do your normal daily activities. This is usually used to diagnose what is causing palpitations/syncope (passing out).   Your physician wants you to follow-up in: 1-2 MONTHS DR HARDING.  You will receive a reminder letter in the mail two months in advance. If you don't receive a letter, please call our office to schedule the follow-up appointment.    If you need a refill on your cardiac medications before your next appointment, please call your pharmacy.

## 2015-10-14 NOTE — Progress Notes (Signed)
PCP: No PCP Per Patient  Clinic Note: Chief Complaint  Patient presents with  . Chest Pain    chest tightness  . Edema    pt states she doesn't have edema in legs but states she see something on her legs dealing with her veins  . Shortness of Breath    some SOB and tightness in chest  . Follow-up    no light headedness or dizziness, pt mom states she is always tired, feeling like she doesn't want to get out the bed    HPI: Misty Guerra is a 19 y.o. female with a PMH below who presents today for re-evaluation for palpitations & chest tightness.Misty Guerra was last seen on 06/11/15 points of syncope and palpitations. She was scheduled to have an event monitor placed as well as echocardiogram and tilt table test. However these never referred because she was not able to obtain Medicaid.  Recent Hospitalizations: She went to the emergency room on October 23 for chest tightness and pressure.  Studies Reviewed: None completed  Interval History: She presents today now with multiple complaints most notably the persistent, more frequent tachypalpitations. No further episodes of syncope, but has had some near syncope. She's very concerned, however that she had significant episode as a marketing episode in addition to several others of severe centralized chest pain that occurred during intercourse. Did not occur during a routine levels of activity at work. It is happening sometimes at rest but other times with intercourse or other recreational activities. She describes his pain is a sharp pressure on the side of her sternum. It makes it difficult for her to breathe and is exacerbated with them deep inspiration or cough. She feels that she cannot comfortable. These episodes last anywhere from 2-10 minutes.  Mother also notes that she has several areas of "bruising regions on her thighs mostly. There is one on her shin that looks like a bruise. She has a violaceous discoloration to her  legs, shown by picture on her telephone  No  shortness of breath with rest or exertion. No PND, orthopnea or edema.  No TIA/amaurosis fugax symptoms. No claudication.  ROS: A comprehensive was performed. Review of Systems  Eyes: Negative.   Respiratory: Positive for cough and shortness of breath (with her episodes of chest pain).   Cardiovascular: Positive for chest pain and palpitations.  Gastrointestinal: Positive for nausea. Negative for blood in stool and melena.  Genitourinary: Negative for hematuria.  Musculoskeletal: Positive for myalgias. Negative for falls.  Skin: Positive for rash (See above).  Endo/Heme/Allergies: Bruises/bleeds easily.  Psychiatric/Behavioral: The patient is nervous/anxious.        Very tearful.  All other systems reviewed and are negative.   Past Medical History  Diagnosis Date  . Heart murmur   . Near syncope   . Palpitations     History reviewed. No pertinent past surgical history.  Prior to Admission medications   Not on File   No Known Allergies   Social History   Social History  . Marital Status: Single    Spouse Name: N/A  . Number of Children: N/A  . Years of Education: N/A   Social History Main Topics  . Smoking status: Former Smoker    Types: Cigarettes  . Smokeless tobacco: None  . Alcohol Use: No  . Drug Use: No  . Sexual Activity: Not Asked   Other Topics Concern  . None   Social History Narrative   Family  History  Problem Relation Age of Onset  . Depression Mother   . Heart attack Paternal Grandfather     Wt Readings from Last 3 Encounters:  10/14/15 128 lb (58.06 kg)  08/31/15 140 lb (63.504 kg)  06/11/15 132 lb 6 oz (60.045 kg)    PHYSICAL EXAM BP 110/60 mmHg  Pulse 59  Ht  (1.702 m)  Wt 128 lb (58.06 kg)  BMI 20.04 kg/m2 General appearance: alert, cooperative, appears stated age, no distress and very anxious/tearful Neck: no adenopathy, ? SOFT bilateral carotid bruit vs. Radiated murmur; and  no JVD Lungs: CTAB, normal percussion bilaterally and non-labored Heart: regular rate and rhythm, S1, S2 normal, soft systolic murmur @ base (sounds SEM), No rub or gallop - but cannot exclude click Abdomen: soft, non-tender; bowel sounds normal; no masses,  no organomegaly; Extremities: extremities normal, atraumatic, no cyanosis, and edema Pulses: 2+ and symmetric; Skin: mobility and turgor normal or diffuse violacious mottling with patchy purpuric/echymotic "lesions" Neurologic: Mental status: Alert, oriented, thought content appropriate Cranial nerves: normal (II-XII grossly intact)    Adult ECG Report  Rate: 59 ;  Rhythm: appears to be Sinus Bradycardiac (but unusual P axis in inferior leads suggests ? ectopic artirial bradycardiac); IRBBB, TWI in II, III, aVF & V3-V6 - consider ischemia  Narrative Interpretation: stable EKG   Other studies Reviewed: Additional studies/ records that were reviewed today include:  Recent Labs:     Chemistry      Component Value Date/Time   NA 136 08/31/2015 1720   K 3.8 08/31/2015 1720   CL 106 08/31/2015 1720   CO2 25 08/31/2015 1720   BUN 11 08/31/2015 1720   CREATININE 0.65 08/31/2015 1720      Component Value Date/Time   CALCIUM 8.6* 08/31/2015 1720      ASSESSMENT / PLAN: Problem List Items Addressed This Visit    Syncope (Chronic)    We can see she has any arrhythmias to explain her symptoms on monitor. I suspect that her syncope was probably vasovagal. No further episodes since last visit.      Relevant Orders   EKG 12-Lead   Exercise Tolerance Test   Holter monitor - 48 hour   Palpitations - Primary    Intermittent tachypalpitations with a baseline abnormal EKG. We talked about doing a month-long event monitor, but now she is saying that is happening every day. I think we can get away with a 48-hour monitor.      Relevant Orders   EKG 12-Lead   Exercise Tolerance Test   Holter monitor - 48 hour   Orthostatic hypotension     Relevant Orders   EKG 12-Lead   Exercise Tolerance Test   Holter monitor - 48 hour   Chest pain with low risk for cardiac etiology    Her symptom of chest pain occurred with relatively vigorous exertion. It doesn't occur with other levels activity. Will then occur with no activity. Very atypical in nature. I think this is probably consistent with costochondritis as it was somewhat reproducible on exam. She does have somewhat of an abnormal EKG, but is probably interpretable. Plan is for ischemic evaluation with [facility].      Cardiac murmur    Based on the abnormal EKG with inverted P waves in leads II, III as well as aVF -the original plan was for an echocardiogram. However were still waiting to see if she has the ability to pay for the echo.  Relevant Orders   EKG 12-Lead   Exercise Tolerance Test   Holter monitor - 48 hour      Current medicines are reviewed at length with the patient today. (+/- concerns) none The following changes have been made: none  Studies Ordered:   Orders Placed This Encounter  Procedures  . Exercise Tolerance Test  . Holter monitor - 48 hour  . EKG 12-Lead     Marykay LexHARDING, Nidya Bouyer W, M.D., M.S. Interventional Cardiologist   Pager # [phone]

## 2015-10-15 ENCOUNTER — Encounter: Payer: Self-pay | Admitting: Cardiology

## 2015-10-15 DIAGNOSIS — R079 Chest pain, unspecified: Secondary | ICD-10-CM | POA: Insufficient documentation

## 2015-10-15 NOTE — Assessment & Plan Note (Signed)
Based on the abnormal EKG with inverted P waves in leads II, III as well as aVF -the original plan was for an echocardiogram. However were still waiting to see if she has the ability to pay for the echo.

## 2015-10-15 NOTE — Assessment & Plan Note (Signed)
Her symptom of chest pain occurred with relatively vigorous exertion. It doesn't occur with other levels activity. Will then occur with no activity. Very atypical in nature. I think this is probably consistent with costochondritis as it was somewhat reproducible on exam. She does have somewhat of an abnormal EKG, but is probably interpretable. Plan is for ischemic evaluation with [facility].

## 2015-10-15 NOTE — Assessment & Plan Note (Signed)
We can see she has any arrhythmias to explain her symptoms on monitor. I suspect that her syncope was probably vasovagal. No further episodes since last visit.

## 2015-10-15 NOTE — Assessment & Plan Note (Signed)
Intermittent tachypalpitations with a baseline abnormal EKG. We talked about doing a month-long event monitor, but now she is saying that is happening every day. I think we can get away with a 48-hour monitor.

## 2015-10-24 ENCOUNTER — Encounter (HOSPITAL_COMMUNITY): Payer: Self-pay

## 2015-11-05 ENCOUNTER — Encounter (HOSPITAL_COMMUNITY): Payer: Self-pay | Admitting: Emergency Medicine

## 2015-11-05 ENCOUNTER — Emergency Department (HOSPITAL_COMMUNITY)
Admission: EM | Admit: 2015-11-05 | Discharge: 2015-11-05 | Disposition: A | Discharge status: Home / Self Care | Payer: Self-pay | Attending: Physician Assistant | Admitting: Physician Assistant

## 2015-11-05 DIAGNOSIS — R509 Fever, unspecified: Secondary | ICD-10-CM | POA: Insufficient documentation

## 2015-11-05 DIAGNOSIS — R011 Cardiac murmur, unspecified: Secondary | ICD-10-CM | POA: Insufficient documentation

## 2015-11-05 DIAGNOSIS — R21 Rash and other nonspecific skin eruption: Secondary | ICD-10-CM | POA: Insufficient documentation

## 2015-11-05 DIAGNOSIS — Z87891 Personal history of nicotine dependence: Secondary | ICD-10-CM | POA: Insufficient documentation

## 2015-11-05 DIAGNOSIS — R51 Headache: Secondary | ICD-10-CM | POA: Insufficient documentation

## 2015-11-05 MED ORDER — TRIAMCINOLONE ACETONIDE 0.1 % EX CREA: 1.0000 "application " | TOPICAL_CREAM | Freq: Two times a day (BID) | CUTANEOUS | Status: DC

## 2015-11-05 MED ORDER — CEPHALEXIN 500 MG PO CAPS: 500.0000 mg | ORAL_CAPSULE | Freq: Four times a day (QID) | ORAL | Status: DC

## 2015-11-05 NOTE — ED Notes (Signed)
PT DISCHARGED. INSTRUCTIONS AND PRESCRIPTIONS GIVEN. AAOX3. PT IN NO APPARENT DISTRESS. THE OPPORTUNITY TO ASK QUESTIONS WAS PROVIDED. 

## 2015-11-05 NOTE — ED Provider Notes (Signed)
History  By signing my name below, I, Karle Plumber, attest that this documentation has been prepared under the direction and in the presence of Josh Emelin Dascenzo, PA-C. Electronically Signed: Karle Plumber, ED Scribe. 11/05/2015. 4:50 PM.  Chief Complaint  Patient presents with  . Insect Bite   The history is provided by the patient and medical records. No language interpreter was used.    HPI Comments:  Misty Guerra is a 19 y.o. female who presents to the Emergency Department complaining of an itching, burning insect bite to the antecubital area of the RUE that appeared two days ago. She states the area looked like a mosquito bite at first. She reports subjective fever, chills and HA. She has not done anything to treat her symptoms. She denies modifying factors. She denies any wound or injury to the area or any recent blood draws. She denies abdominal pain, nausea or vomiting. Pt denies any allergies to any medications. She denies any new medications, soaps, detergents, creams or any other personal hygiene products.  Past Medical History  Diagnosis Date  . Heart murmur   . Near syncope   . Palpitations    No past surgical history on file. Family History  Problem Relation Age of Onset  . Depression Mother   . Heart attack Paternal Grandfather    Social History  Substance Use Topics  . Smoking status: Former Smoker    Types: Cigarettes  . Smokeless tobacco: None  . Alcohol Use: No   OB History    No data available     Review of Systems  Constitutional: Positive for fever (subjective) and chills.  HENT: Negative for facial swelling and trouble swallowing.   Eyes: Negative for redness.  Respiratory: Negative for shortness of breath, wheezing and stridor.   Cardiovascular: Negative for chest pain.  Gastrointestinal: Negative for nausea, vomiting and abdominal pain.  Musculoskeletal: Negative for myalgias, neck pain and neck stiffness.  Skin: Positive for color change.  Negative for rash.  Neurological: Positive for headaches. Negative for light-headedness.  Psychiatric/Behavioral: Negative for confusion.    Allergies  Review of patient's allergies indicates no known allergies.  Home Medications   Prior to Admission medications   Not on File   Triage Vitals: BP 142/92 mmHg  Pulse 77  Temp(Src) 98.9 F (37.2 C) (Oral)  Resp 18  SpO2 96% Physical Exam  Constitutional: She appears well-developed and well-nourished.  HENT:  Head: Normocephalic and atraumatic.  Eyes: Conjunctivae and EOM are normal.  Neck: Normal range of motion. Neck supple.  No meningismus.  Cardiovascular: Normal rate.   No murmur heard. No murmur detected at time of exam.  Pulmonary/Chest: Effort normal. No respiratory distress.  Musculoskeletal: Normal range of motion.  Neurological: She is alert.  Skin: Skin is warm and dry.  2 cm 3 cm area of thickened dry skin, erythematous, non-scaling, well-demarcated to the right antecubital area. No significant warmth. No palpable abscess. No lymphangitis spreading from the area. No splinter hemorrhages noted on extremities.   Psychiatric: She has a normal mood and affect. Her behavior is normal.  Nursing note and vitals reviewed.   ED Course  Procedures (including critical care time) DIAGNOSTIC STUDIES: Oxygen Saturation is 96% on RA, normal by my interpretation.   COORDINATION OF CARE: 4:50 PM- Will prescribe Triamcinolone cream and Keflex. Return precautions discussed. Pt verbalizes understanding and agrees to plan.  Medications - No data to display   Patient seen and examined.   Vital signs reviewed and are  as follows: BP 142/92 mmHg  Pulse 77  Temp(Src) 98.9 F (37.2 C) (Oral)  Resp 18  SpO2 96%  Will treat with Keflex given reported subjective fevers, however patient does not appear to have significant cellulitis or infection at this time. Feel that it is prudent to cover her for cellulitis. Will give  triamcinolone ointment use topically as well.  Pt urged to return with worsening pain, worsening swelling, expanding area of redness or streaking up extremity, fever, or any other concerns. Urged to take complete course of antibiotics as prescribed. Pt verbalizes understanding and agrees with plan.    MDM   Final diagnoses:  Rash and nonspecific skin eruption   Patient with rash. Allergic versus cellulitic in nature. Patient appears well, nontoxic. She reports headache but has no meningismus or neck pain. She appears nontoxic. Vital signs are normal. Subjective fever but no documented fever.  I personally performed the services described in this documentation, which was scribed in my presence. The recorded information has been reviewed and is accurate.    Renne CriglerJoshua Rohail Klees, PA-C 11/05/15 1659  Courteney Randall AnLyn Mackuen, MD 11/05/15 2357

## 2015-11-05 NOTE — ED Notes (Addendum)
Pt comes to ed triage, for insect bite on right arm ( measures app 3 inches in size). V/s within normal range, send to fast track. Family with pt. Pt reports a previous fever and frontal headache.  Pt also reports spider veins on her leg, as stated by mother which concern her.

## 2015-11-05 NOTE — Discharge Instructions (Signed)
Please read and follow all provided instructions.  Your diagnoses today include:  1. Rash and nonspecific skin eruption     Tests performed today include:  Vital signs. See below for your results today.   Medications prescribed:   Keflex (cephalexin) - antibiotic  You have been prescribed an antibiotic medicine: take the entire course of medicine even if you are feeling better. Stopping early can cause the antibiotic not to work.   Triamcinolone (Kenalog) cream - topical steroid medication for skin reaction  Take any prescribed medications only as directed.   Home care instructions:  Follow any educational materials contained in this packet. Keep affected area above the level of your heart when possible. Wash area gently twice a day with warm soapy water. Do not apply alcohol or hydrogen peroxide. Cover the area if it draining or weeping.   Follow-up instructions: Return to the Emergency Department in 48 hours for a recheck if your symptoms are not significantly improved.   Please follow-up with your primary care provider in the next 1 week for further evaluation of your symptoms.   Return instructions:  Return to the Emergency Department if you have:  Fever  Worsening symptoms  Worsening pain  Worsening swelling  Redness of the skin that moves away from the affected area, especially if it streaks away from the affected area   Any other emergent concerns  Your vital signs today were: BP 142/92 mmHg   Pulse 77   Temp(Src) 98.9 F (37.2 C) (Oral)   Resp 18   SpO2 96% If your blood pressure (BP) was elevated above 135/85 this visit, please have this repeated by your doctor within one month. --------------

## 2015-11-05 NOTE — ED Notes (Signed)
INITIAL ASSESSMENT COMPLETED. PT C/O A REDDENED RASH TO THE RAC X2 DAYS. PT STATES SHE WOKE UP WITH WHAT LOOKED LIKE A MOSQUITO BITE. SHE STATES IT WAS ITCHY AND IRRITATING. SHE ALSO STATES SHE HAD CHILLS AND FEVER ALL DAY YESTERDAY. TODAY, THE AREA LOOKS LIKE A RASH AND IT IS PAINFUL TO STRETCH OUT THE ARM. DENIES DRAINAGE. AWAITING FURTHER ORDERS.

## 2015-11-07 ENCOUNTER — Telehealth (HOSPITAL_COMMUNITY): Payer: Self-pay

## 2015-11-07 NOTE — Telephone Encounter (Signed)
Encounter complete. 

## 2015-11-12 ENCOUNTER — Inpatient Hospital Stay (HOSPITAL_COMMUNITY): Admission: RE | Admit: 2015-11-12 | Payer: Self-pay | Source: Ambulatory Visit

## 2015-11-23 ENCOUNTER — Emergency Department (HOSPITAL_COMMUNITY)
Admission: EM | Admit: 2015-11-23 | Discharge: 2015-11-23 | Disposition: A | Discharge status: Home / Self Care | Payer: Self-pay | Attending: Emergency Medicine | Admitting: Emergency Medicine

## 2015-11-23 ENCOUNTER — Encounter (HOSPITAL_COMMUNITY): Payer: Self-pay | Admitting: Emergency Medicine

## 2015-11-23 DIAGNOSIS — R0602 Shortness of breath: Secondary | ICD-10-CM | POA: Insufficient documentation

## 2015-11-23 DIAGNOSIS — R079 Chest pain, unspecified: Secondary | ICD-10-CM | POA: Insufficient documentation

## 2015-11-23 DIAGNOSIS — Z87891 Personal history of nicotine dependence: Secondary | ICD-10-CM | POA: Insufficient documentation

## 2015-11-23 DIAGNOSIS — R011 Cardiac murmur, unspecified: Secondary | ICD-10-CM | POA: Insufficient documentation

## 2015-11-23 DIAGNOSIS — R002 Palpitations: Secondary | ICD-10-CM | POA: Insufficient documentation

## 2015-11-23 DIAGNOSIS — R238 Other skin changes: Secondary | ICD-10-CM | POA: Insufficient documentation

## 2015-11-23 DIAGNOSIS — R5383 Other fatigue: Secondary | ICD-10-CM | POA: Insufficient documentation

## 2015-11-23 DIAGNOSIS — L819 Disorder of pigmentation, unspecified: Secondary | ICD-10-CM

## 2015-11-23 LAB — BASIC METABOLIC PANEL
ANION GAP: 8 (ref 5–15)
BUN: 9 mg/dL (ref 6–20)
CO2: 25 mmol/L (ref 22–32)
Calcium: 8.9 mg/dL (ref 8.9–10.3)
Chloride: 106 mmol/L (ref 101–111)
Creatinine, Ser: 0.62 mg/dL (ref 0.44–1.00)
GLUCOSE: 109 mg/dL — AB (ref 65–99)
POTASSIUM: 3.9 mmol/L (ref 3.5–5.1)
Sodium: 139 mmol/L (ref 135–145)

## 2015-11-23 LAB — CBC WITH DIFFERENTIAL/PLATELET
BASOS ABS: 0 10*3/uL (ref 0.0–0.1)
Basophils Relative: 0 %
Eosinophils Absolute: 0.3 10*3/uL (ref 0.0–0.7)
Eosinophils Relative: 4 %
HEMATOCRIT: 36 % (ref 36.0–46.0)
HEMOGLOBIN: 11.8 g/dL — AB (ref 12.0–15.0)
LYMPHS ABS: 2.4 10*3/uL (ref 0.7–4.0)
LYMPHS PCT: 32 %
MCH: 29.7 pg (ref 26.0–34.0)
MCHC: 32.8 g/dL (ref 30.0–36.0)
MCV: 90.7 fL (ref 78.0–100.0)
Monocytes Absolute: 0.6 10*3/uL (ref 0.1–1.0)
Monocytes Relative: 8 %
NEUTROS ABS: 4.1 10*3/uL (ref 1.7–7.7)
NEUTROS PCT: 56 %
Platelets: 198 10*3/uL (ref 150–400)
RBC: 3.97 MIL/uL (ref 3.87–5.11)
RDW: 13.1 % (ref 11.5–15.5)
WBC: 7.4 10*3/uL (ref 4.0–10.5)

## 2015-11-23 LAB — SEDIMENTATION RATE: SED RATE: 2 mm/h (ref 0–22)

## 2015-11-23 MED ORDER — PREDNISONE 20 MG PO TABS: 40.0000 mg | ORAL_TABLET | Freq: Every day | ORAL | Status: AC

## 2015-11-23 NOTE — ED Notes (Signed)
see triage note. pt reports mottling/ discoloration of left leg veins starting Dec 1st. since then discoloration has progressed. covers left thigh area, now starting on left upper arm.pain from left waist down leg.pain 7/10. numbness t/ingling in left leg. Reports cardiologist wants to pt to have more procedures, but cannot get approved until medicaid gets approved. Mother reports pt is constantly tired

## 2015-11-23 NOTE — ED Provider Notes (Signed)
CSN: [id number]     Arrival date & time 11/23/15  1410 History   First MD Initiated Contact with Patient 11/23/15 1825     Chief Complaint  Patient presents with  . Leg Pain  . Mottled Skin      (Consider location/radiation/quality/duration/timing/severity/associated sxs/prior Treatment) HPI Comments: Patient with past medical history of heart murmur, prior syncope, and heart palpitations presents to the emergency department with chief complaint of heart palpitations. She states that she has been having palpitations for the past several months. She reports associated shortness of breath and some chest pain when the palpitations come. She has seen Dr. Ellyn Hack with cardiology, who is recommended and event monitor and a stress test, but the patient has been unable to have these test done because of financial reasons. Patient states the symptoms have become more persistent and are increasing in frequency. She does not associate them with anything. She states that she feels very fatigued. She denies any fevers, chills, cough, nausea, vomiting, or diarrhea. She denies any recent long travel. Denies any history of PE or DVT. Denies any recent localization for surgery. Showing, she states that she has had mottling of her legs that started December 1 last year. She reports occasional pain in her extremities, but this is not always present.  The history is provided by the patient. No language interpreter was used.    Past Medical History  Diagnosis Date  . Heart murmur   . Near syncope   . Palpitations    No past surgical history on file. Family History  Problem Relation Age of Onset  . Depression Mother   . Heart attack Paternal Grandfather    Social History  Substance Use Topics  . Smoking status: Former Smoker    Types: Cigarettes  . Smokeless tobacco: None  . Alcohol Use: No   OB History    No data available     Review of Systems  Constitutional: Negative for fever and chills.   Respiratory: Negative for shortness of breath.   Cardiovascular: Positive for palpitations. Negative for chest pain.  Gastrointestinal: Negative for nausea, vomiting, diarrhea and constipation.  Genitourinary: Negative for dysuria.  Skin: Positive for color change.  All other systems reviewed and are negative.     Allergies  Review of patient's allergies indicates no known allergies.  Home Medications   Prior to Admission medications   Medication Sig Start Date End Date Taking? Authorizing Provider  cephALEXin (KEFLEX) 500 MG capsule Take 1 capsule (500 mg total) by mouth 4 (four) times daily. Patient not taking: Reported on 11/23/2015 11/05/15   Carlisle Cater, PA-C  triamcinolone cream (KENALOG) 0.1 % Apply 1 application topically 2 (two) times daily. Patient not taking: Reported on 11/23/2015 11/05/15   Carlisle Cater, PA-C   BP 133/77 mmHg  Pulse 74  Temp(Src) 98.2 F (36.8 C) (Oral)  Resp 16  SpO2 100% Physical Exam  Constitutional: She is oriented to person, place, and time. She appears well-developed and well-nourished.  HENT:  Head: Normocephalic and atraumatic.  Eyes: Conjunctivae and EOM are normal. Pupils are equal, round, and reactive to light.  Neck: Normal range of motion. Neck supple.  Cardiovascular: Normal rate and regular rhythm.  Exam reveals no gallop and no friction rub.   No murmur heard. Pulmonary/Chest: Effort normal and breath sounds normal. No respiratory distress. She has no wheezes. She has no rales. She exhibits no tenderness.  Abdominal: Soft. Bowel sounds are normal. She exhibits no distension and no mass.  There is no tenderness. There is no rebound and no guarding.  Musculoskeletal: Normal range of motion. She exhibits no edema or tenderness.  Neurological: She is alert and oriented to person, place, and time.  Skin: Skin is warm and dry.  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 Her behavior is normal. Judgment and thought content normal.  Nursing note and vitals reviewed.   ED Course  Procedures (including critical care time) Results for orders placed or performed during the hospital encounter of 11/23/15  CBC with Differential/Platelet  Result Value Ref Range   WBC 7.4 4.0 - 10.5 K/uL   RBC 3.97 3.87 - 5.11 MIL/uL   Hemoglobin 11.8 (L) 12.0 - 15.0 g/dL   HCT 36.0 36.0 - 46.0 %   MCV 90.7 78.0 - 100.0 fL   MCH 29.7 26.0 - 34.0 pg   MCHC 32.8 30.0 - 36.0 g/dL   RDW 13.1 11.5 - 15.5 %   Platelets 198 150 - 400 K/uL   Neutrophils Relative % 56 %   Neutro Abs 4.1 1.7 - 7.7 K/uL   Lymphocytes Relative 32 %   Lymphs Abs 2.4 0.7 - 4.0 K/uL   Monocytes Relative 8 %   Monocytes Absolute 0.6 0.1 - 1.0 K/uL   Eosinophils Relative 4 %   Eosinophils Absolute 0.3 0.0 - 0.7 K/uL   Basophils Relative 0 %   Basophils Absolute 0.0 0.0 - 0.1 K/uL  Basic metabolic panel  Result Value Ref Range   Sodium 139 135 - 145 mmol/L   Potassium 3.9 3.5 - 5.1 mmol/L   Chloride 106 101 - 111 mmol/L   CO2 25 22 - 32 mmol/L   Glucose, Bld 109 (H) 65 - 99 mg/dL   BUN 9 6 - 20 mg/dL   Creatinine, Ser 0.62 0.44 - 1.00 mg/dL   Calcium 8.9 8.9 - 10.3 mg/dL   GFR calc non Af Amer >60 >60 mL/min   GFR calc Af Amer >60 >60 mL/min   Anion gap 8 5 - 15   No results found.  I have personally reviewed and evaluated these images and lab results as part of my medical decision-making.   EKG Interpretation   Date/Time:  Sunday November 23 2015 19:03:47 EST Ventricular Rate:  65 PR Interval:  143 QRS Duration: 105 QT Interval:  402 QTC Calculation: 418 R Axis:   12 Text Interpretation:  Ectopic atrial rhythm RSR' in V1 or V2, right VCD or  RVH Borderline T abnormalities, anterior leads No significant change since  last tracing Confirmed by KNAPP  MD-J, JON ([id number]) on 11/23/2015 7:11:44 PM      MDM   Final diagnoses:  Palpitations  Mottled skin     Patient with history of palpitations.  Has been seen by cardiology for this a couple of time recently.  Advised that patient should have a stress test and a neck, but the patient has been unable to afford getting these tests done. She has had some persistent intermittent palpitations. Will check labs, EKG. Additionally patient has some mottled skin on her thighs.  Labs are reassuring. EKG unchanged since last. Patient seen by and discussed with Dr. Jeneen Rinks, who recommends checking an ESR for convenience follow-up. Some rheumatologic processes can have mottled skin such as what the patient is presenting with. Will start on prednisone taper and recommend close follow-up. Patient has instructions to follow-up with the cardiologist as well.  I have given her information to contact the care management team regarding gaining access to the orange card. She understands and agrees with this plan. No further emergent workup needed tonight. Patient is stable and ready for discharge.    Montine Circle, PA-C 11/23/15 [place], MD 11/27/15 1324

## 2015-11-23 NOTE — ED Notes (Signed)
PA at bedside.

## 2015-11-23 NOTE — ED Notes (Signed)
Pt alert and oriented x4. Respirations even and unlabored, bilateral symmetrical rise and fall of chest. Skin warm and dry. In no acute distress. Denies needs.   

## 2015-11-23 NOTE — Discharge Instructions (Signed)

## 2015-11-23 NOTE — ED Notes (Signed)
Contacted lab to add sed rate.  Toniann FailWendy states that will call back if does not have enough to run test.

## 2015-11-23 NOTE — ED Notes (Signed)
Pt states that she has had a month of severe mottling of skin.  States that she has been having leg pain which is where the mottling is worse.  Pt states that she is always achy, tired, and was referred to a cardiologist.  States she was unable to get proper follow up d/t insurance.

## 2015-11-27 ENCOUNTER — Encounter (HOSPITAL_COMMUNITY): Payer: Self-pay

## 2015-11-27 ENCOUNTER — Emergency Department (HOSPITAL_COMMUNITY): Payer: Self-pay

## 2015-11-27 ENCOUNTER — Emergency Department (HOSPITAL_COMMUNITY)
Admission: EM | Admit: 2015-11-27 | Discharge: 2015-11-27 | Disposition: A | Discharge status: Home / Self Care | Payer: Self-pay | Attending: Emergency Medicine | Admitting: Emergency Medicine

## 2015-11-27 DIAGNOSIS — R011 Cardiac murmur, unspecified: Secondary | ICD-10-CM | POA: Insufficient documentation

## 2015-11-27 DIAGNOSIS — Z87891 Personal history of nicotine dependence: Secondary | ICD-10-CM | POA: Insufficient documentation

## 2015-11-27 DIAGNOSIS — R002 Palpitations: Secondary | ICD-10-CM | POA: Insufficient documentation

## 2015-11-27 DIAGNOSIS — R55 Syncope and collapse: Secondary | ICD-10-CM | POA: Insufficient documentation

## 2015-11-27 DIAGNOSIS — R079 Chest pain, unspecified: Secondary | ICD-10-CM | POA: Insufficient documentation

## 2015-11-27 DIAGNOSIS — N390 Urinary tract infection, site not specified: Secondary | ICD-10-CM | POA: Insufficient documentation

## 2015-11-27 DIAGNOSIS — R42 Dizziness and giddiness: Secondary | ICD-10-CM | POA: Insufficient documentation

## 2015-11-27 DIAGNOSIS — E860 Dehydration: Secondary | ICD-10-CM | POA: Insufficient documentation

## 2015-11-27 DIAGNOSIS — Z3202 Encounter for pregnancy test, result negative: Secondary | ICD-10-CM | POA: Insufficient documentation

## 2015-11-27 LAB — D-DIMER, QUANTITATIVE: D-Dimer, Quant: 0.34 ug/mL-FEU (ref 0.00–0.50)

## 2015-11-27 LAB — URINALYSIS, ROUTINE W REFLEX MICROSCOPIC
GLUCOSE, UA: NEGATIVE mg/dL
Ketones, ur: NEGATIVE mg/dL
Nitrite: NEGATIVE
PROTEIN: NEGATIVE mg/dL
SPECIFIC GRAVITY, URINE: 1.029 (ref 1.005–1.030)
pH: 6 (ref 5.0–8.0)

## 2015-11-27 LAB — CBC
HEMATOCRIT: 39.7 % (ref 36.0–46.0)
Hemoglobin: 13 g/dL (ref 12.0–15.0)
MCH: 29.6 pg (ref 26.0–34.0)
MCHC: 32.7 g/dL (ref 30.0–36.0)
MCV: 90.4 fL (ref 78.0–100.0)
PLATELETS: 254 10*3/uL (ref 150–400)
RBC: 4.39 MIL/uL (ref 3.87–5.11)
RDW: 13.2 % (ref 11.5–15.5)
WBC: 12.6 10*3/uL — ABNORMAL HIGH (ref 4.0–10.5)

## 2015-11-27 LAB — URINE MICROSCOPIC-ADD ON

## 2015-11-27 LAB — BASIC METABOLIC PANEL
Anion gap: 9 (ref 5–15)
BUN: 5 mg/dL — AB (ref 6–20)
CO2: 21 mmol/L — ABNORMAL LOW (ref 22–32)
CREATININE: 0.6 mg/dL (ref 0.44–1.00)
Calcium: 9.3 mg/dL (ref 8.9–10.3)
Chloride: 109 mmol/L (ref 101–111)
GFR calc Af Amer: >60 mL/min (ref >60)
GLUCOSE: 108 mg/dL — AB (ref 65–99)
POTASSIUM: 3.4 mmol/L — AB (ref 3.5–5.1)
Sodium: 139 mmol/L (ref 135–145)

## 2015-11-27 LAB — I-STAT BETA HCG BLOOD, ED (MC, WL, AP ONLY): I-stat hCG, quantitative: <5 m[IU]/mL (ref <5)

## 2015-11-27 LAB — I-STAT TROPONIN, ED: TROPONIN I, POC: 0 ng/mL (ref 0.00–0.08)

## 2015-11-27 LAB — CBG MONITORING, ED: GLUCOSE-CAPILLARY: 107 mg/dL — AB (ref 65–99)

## 2015-11-27 MED ORDER — SODIUM CHLORIDE 0.9 % IV BOLUS (SEPSIS)
1000.0000 mL | Freq: Once | INTRAVENOUS | Status: AC
  Administered 2015-11-27: 1000 mL via INTRAVENOUS

## 2015-11-27 MED ORDER — CEPHALEXIN 500 MG PO CAPS: 500.0000 mg | ORAL_CAPSULE | Freq: Two times a day (BID) | ORAL | Status: AC

## 2015-11-27 NOTE — Discharge Instructions (Signed)
Urinary Tract Infection Urinary tract infections (UTIs) can develop anywhere along your urinary tract. Your urinary tract is your body's drainage system for removing wastes and extra water. Your urinary tract includes two kidneys, two ureters, a bladder, and a urethra. Your kidneys are a pair of bean-shaped organs. Each kidney is about the size of your fist. They are located below your ribs, one on each side of your spine. CAUSES Infections are caused by microbes, which are microscopic organisms, including fungi, viruses, and bacteria. These organisms are so small that they can only be seen through a microscope. Bacteria are the microbes that most commonly cause UTIs. SYMPTOMS  Symptoms of UTIs may vary by age and gender of the patient and by the location of the infection. Symptoms in young women typically include a frequent and intense urge to urinate and a painful, burning feeling in the bladder or urethra during urination. Older women and men are more likely to be tired, shaky, and weak and have muscle aches and abdominal pain. A fever may mean the infection is in your kidneys. Other symptoms of a kidney infection include pain in your back or sides below the ribs, nausea, and vomiting. DIAGNOSIS To diagnose a UTI, your caregiver will ask you about your symptoms. Your caregiver will also ask you to provide a urine sample. The urine sample will be tested for bacteria and white blood cells. White blood cells are made by your body to help fight infection. TREATMENT  Typically, UTIs can be treated with medication. Because most UTIs are caused by a bacterial infection, they usually can be treated with the use of antibiotics. The choice of antibiotic and length of treatment depend on your symptoms and the type of bacteria causing your infection. HOME CARE INSTRUCTIONS  If you were prescribed antibiotics, take them exactly as your caregiver instructs you. Finish the medication even if you feel better after  you have only taken some of the medication.  Drink enough water and fluids to keep your urine clear or pale yellow.  Avoid caffeine, tea, and carbonated beverages. They tend to irritate your bladder.  Empty your bladder often. Avoid holding urine for long periods of time.  Empty your bladder before and after sexual intercourse.  After a bowel movement, women should cleanse from front to back. Use each tissue only once. SEEK MEDICAL CARE IF:   You have back pain.  You develop a fever.  Your symptoms do not begin to resolve within 3 days. SEEK IMMEDIATE MEDICAL CARE IF:   You have severe back pain or lower abdominal pain.  You develop chills.  You have nausea or vomiting.  You have continued burning or discomfort with urination. MAKE SURE YOU:   Understand these instructions.  Will watch your condition.  Will get help right away if you are not doing well or get worse.   This information is not intended to replace advice given to you by your health care provider. Make sure you discuss any questions you have with your health care provider.   Document Released: 08/04/2005 Document Revised: 07/16/2015 Document Reviewed: 12/03/2011 Elsevier Interactive Patient Education 2016 [employer]. Syncope Syncope is a medical term for fainting or passing out. This means you lose consciousness and drop to the ground. People are generally unconscious for less than 5 minutes. You may have some muscle twitches for up to 15 seconds before waking up and returning to normal. Syncope occurs more often in older adults, but it can happen to anyone.  While most causes of syncope are not dangerous, syncope can be a sign of a serious medical problem. It is important to seek medical care.  CAUSES  Syncope is caused by a sudden drop in blood flow to the brain. The specific cause is often not determined. Factors that can bring on syncope include:  Taking medicines that lower blood pressure.  Sudden  changes in posture, such as standing up quickly.  Taking more medicine than prescribed.  Standing in one place for too long.  Seizure disorders.  Dehydration and excessive exposure to heat.  Low blood sugar (hypoglycemia).  Straining to have a bowel movement.  Heart disease, irregular heartbeat, or other circulatory problems.  Fear, emotional distress, seeing blood, or severe pain. SYMPTOMS  Right before fainting, you may:  Feel dizzy or light-headed.  Feel nauseous.  See all white or all black in your field of vision.  Have cold, clammy skin. DIAGNOSIS  Your health care provider will ask about your symptoms, perform a physical exam, and perform an electrocardiogram (ECG) to record the electrical activity of your heart. Your health care provider may also perform other heart or blood tests to determine the cause of your syncope which may include:  Transthoracic echocardiogram (TTE). During echocardiography, sound waves are used to evaluate how blood flows through your heart.  Transesophageal echocardiogram (TEE).  Cardiac monitoring. This allows your health care provider to monitor your heart rate and rhythm in real time.  Holter monitor. This is a portable device that records your heartbeat and can help diagnose heart arrhythmias. It allows your health care provider to track your heart activity for several days, if needed.  Stress tests by exercise or by giving medicine that makes the heart beat faster. TREATMENT  In most cases, no treatment is needed. Depending on the cause of your syncope, your health care provider may recommend changing or stopping some of your medicines. HOME CARE INSTRUCTIONS  Have someone stay with you until you feel stable.  Do not drive, use machinery, or play sports until your health care provider says it is okay.  Keep all follow-up appointments as directed by your health care provider.  Lie down right away if you start feeling like you  might faint. Breathe deeply and steadily. Wait until all the symptoms have passed.  Drink enough fluids to keep your urine clear or pale yellow.  If you are taking blood pressure or heart medicine, get up slowly and take several minutes to sit and then stand. This can reduce dizziness. SEEK IMMEDIATE MEDICAL CARE IF:   You have a severe headache.  You have unusual pain in the chest, abdomen, or back.  You are bleeding from your mouth or rectum, or you have black or tarry stool.  You have an irregular or very fast heartbeat.  You have pain with breathing.  You have repeated fainting or seizure-like jerking during an episode.  You faint when sitting or lying down.  You have confusion.  You have trouble walking.  You have severe weakness.  You have vision problems. If you fainted, call your local emergency services (911 in [place] Do not drive yourself to the hospital.    This information is not intended to replace advice given to you by your health care provider. Make sure you discuss any questions you have with your health care provider.   Document Released: 10/25/2005 Document Revised: 03/11/2015 Document Reviewed: 12/24/2011 Elsevier Interactive Patient Education 2016 [employer] Dehydration, Adult Dehydration is a condition  in which you do not have enough fluid or water in your body. It happens when you take in less fluid than you lose. Vital organs such as the kidneys, brain, and heart cannot function without a proper amount of fluids. Any loss of fluids from the body can cause dehydration.  Dehydration can range from mild to severe. This condition should be treated right away to help prevent it from becoming severe. CAUSES  This condition may be caused by:  Vomiting.  Diarrhea.  Excessive sweating, such as when exercising in hot or humid weather.  Not drinking enough fluid during strenuous exercise or during an illness.  Excessive urine  output.  Fever.  Certain medicines. RISK FACTORS This condition is more likely to develop in:  People who are taking certain medicines that cause the body to lose excess fluid (diuretics).   People who have a chronic illness, such as diabetes, that may increase urination.  Older adults.   People who live at high altitudes.   People who participate in endurance sports.  SYMPTOMS  Mild Dehydration  Thirst.  Dry lips.  Slightly dry mouth.  Dry, warm skin. Moderate Dehydration  Very dry mouth.   Muscle cramps.   Dark urine and decreased urine production.   Decreased tear production.   Headache.   Light-headedness, especially when you stand up from a sitting position.  Severe Dehydration  Changes in skin.   Cold and clammy skin.   Skin does not spring back quickly when lightly pinched and released.   Changes in body fluids.   Extreme thirst.   No tears.   Not able to sweat when body temperature is high, such as in hot weather.   Minimal urine production.   Changes in vital signs.   Rapid, weak pulse (more than 100 beats per minute when you are sitting still).   Rapid breathing.   Low blood pressure.   Other changes.   Sunken eyes.   Cold hands and feet.   Confusion.  Lethargy and difficulty being awakened.  Fainting (syncope).   Short-term weight loss.   Unconsciousness. DIAGNOSIS  This condition may be diagnosed based on your symptoms. You may also have tests to determine how severe your dehydration is. These tests may include:   Urine tests.   Blood tests.  TREATMENT  Treatment for this condition depends on the severity. Mild or moderate dehydration can often be treated at home. Treatment should be started right away. Do not wait until dehydration becomes severe. Severe dehydration needs to be treated at the hospital. Treatment for Mild Dehydration  Drinking plenty of water to replace the fluid you  have lost.   Replacing minerals in your blood (electrolytes) that you may have lost.  Treatment for Moderate Dehydration  Consuming oral rehydration solution (ORS). Treatment for Severe Dehydration  Receiving fluid through an IV tube.   Receiving electrolyte solution through a feeding tube that is passed through your nose and into your stomach (nasogastric tube or NG tube).  Correcting any abnormalities in electrolytes. HOME CARE INSTRUCTIONS   Drink enough fluid to keep your urine clear or pale yellow.   Drink water or fluid slowly by taking small sips. You can also try sucking on ice cubes.  Have food or beverages that contain electrolytes. Examples include bananas and sports drinks.  Take over-the-counter and prescription medicines only as told by your health care provider.   Prepare ORS according to the manufacturer's instructions. Take sips of ORS every 5 minutes  until your urine returns to normal.  If you have vomiting or diarrhea, continue to try to drink water, ORS, or both.   If you have diarrhea, avoid:   Beverages that contain caffeine.   Fruit juice.   Milk.   Carbonated soft drinks.  Do not take salt tablets. This can lead to the condition of having too much sodium in your body (hypernatremia).  SEEK MEDICAL CARE IF:  You cannot eat or drink without vomiting.  You have had moderate diarrhea during a period of more than 24 hours.  You have a fever. SEEK IMMEDIATE MEDICAL CARE IF:   You have extreme thirst.  You have severe diarrhea.  You have not urinated in 6-8 hours, or you have urinated only a small amount of very dark urine.  You have shriveled skin.  You are dizzy, confused, or both.   This information is not intended to replace advice given to you by your health care provider. Make sure you discuss any questions you have with your health care provider.   Document Released: 10/25/2005 Document Revised: 07/16/2015 Document  Reviewed: 03/12/2015 Elsevier Interactive Patient Education [employer]

## 2015-11-27 NOTE — ED Notes (Signed)
Patient arrived by EMS after having syncopal episode while in shower, reports headache with same, hit head during fall

## 2015-11-27 NOTE — ED Provider Notes (Signed)
CSN: [id number]     Arrival date & time 11/27/15  1221 History   First MD Initiated Contact with Patient 11/27/15 1803     Chief Complaint  Patient presents with  . Loss of Consciousness   Misty Guerra is a 20 y.o. female who presents to the emergency department after syncopal episode after getting in the shower this morning. Patient reports she entered the shower and felt lightheaded, got out of the shower and sat on the toilet. She then passed out onto the ground. She reports hitting the back of her head. She reports she was able to get up immediately and went and found her boyfriend and then she came to the emergency department. She reports that after the syncopal episode she had 1-2 minutes of the chest pain, that has since resolved. She denies any shortness of breath. She reports she had palpitations earlier but this is resolved. She reports feeling lightheaded with position change. She also reports urinary frequency for the past week. The patient has been seen by cardiology in the past for palpitations and near-syncopal episodes. Dr. Herbie Baltimore wanted to do an event monitor but this has not been done. He suspected vasovagal syncope.  Patient was recently seen in the emergency department for mottled skin on her leg and they suspect she has lupus. The patient denies fevers, numbness, tingling, weakness, double vision, neck pain, neck stiffness, headache, chest pain, shortness of breath, coughing, wheezing, abdominal pain, nausea, vomiting, diarrhea. No seizure like activity was identified.   (Consider location/radiation/quality/duration/timing/severity/associated sxs/prior Treatment) HPI  Past Medical History  Diagnosis Date  . Heart murmur   . Near syncope   . Palpitations    History reviewed. No pertinent past surgical history. Family History  Problem Relation Age of Onset  . Depression Mother   . Heart attack Paternal Grandfather    Social History  Substance Use Topics  . Smoking  status: Former Smoker    Types: Cigarettes  . Smokeless tobacco: None  . Alcohol Use: No   OB History    No data available     Review of Systems  Constitutional: Negative for fever and chills.  HENT: Negative for congestion and sore throat.   Eyes: Negative for pain and visual disturbance.  Respiratory: Negative for cough, shortness of breath and wheezing.   Cardiovascular: Positive for chest pain and palpitations.  Gastrointestinal: Negative for nausea, vomiting, abdominal pain and diarrhea.  Genitourinary: Positive for frequency. Negative for dysuria, urgency, hematuria, flank pain, decreased urine volume and difficulty urinating.  Musculoskeletal: Negative for back pain and neck pain.  Skin: Negative for rash.  Neurological: Positive for syncope and light-headedness. Negative for dizziness, seizures, speech difficulty, weakness, numbness and headaches.      Allergies  Review of patient's allergies indicates no known allergies.  Home Medications   Prior to Admission medications   Medication Sig Start Date End Date Taking? Authorizing Provider  etonogestrel (IMPLANON) 68 MG IMPL implant 1 each by Subdermal route once. April 2013   Yes Historical Provider, MD  ibuprofen (ADVIL,MOTRIN) 200 MG tablet Take 400 mg by mouth every 6 (six) hours as needed for mild pain.   Yes Historical Provider, MD  predniSONE (DELTASONE) 20 MG tablet Take 2 tablets (40 mg total) by mouth daily. Take 1 tablet morning and night for 5 days, then 1 tablet each morning for 5 days 11/23/15  Yes Roxy Horseman, PA-C  cephALEXin (KEFLEX) 500 MG capsule Take 1 capsule (500 mg total) by mouth 2 (  two) times daily. 11/27/15   Everlene Farrier, PA-C   BP 117/73 mmHg  Pulse 65  Resp 14  SpO2 100%  LMP 11/13/2015 Physical Exam  Constitutional: She is oriented to person, place, and time. She appears well-developed and well-nourished. No distress.  Nontoxic appearing.  HENT:  Head: Normocephalic and atraumatic.   Right Ear: External ear normal.  Left Ear: External ear normal.  Mouth/Throat: Oropharynx is clear and moist.  No evidence of any head trauma.  Eyes: Conjunctivae and EOM are normal. Pupils are equal, round, and reactive to light. Right eye exhibits no discharge. Left eye exhibits no discharge.  Neck: Normal range of motion. Neck supple. No JVD present. No tracheal deviation present.  Cardiovascular: Normal rate, regular rhythm, normal heart sounds and intact distal pulses.  Exam reveals no gallop and no friction rub.   No murmur heard. Bilateral radial, posterior tibialis and dorsalis pedis pulses are intact.    Pulmonary/Chest: Effort normal and breath sounds normal. No respiratory distress. She has no wheezes. She has no rales. She exhibits no tenderness.  Lungs clear to auscultation bilaterally.  Abdominal: Soft. There is no tenderness. There is no guarding.  Musculoskeletal: She exhibits no edema or tenderness.  No lower extremity edema or tenderness. 5 out of 5 strength in her bilateral upper and lower extremities.  Lymphadenopathy:    She has no cervical adenopathy.  Neurological: She is alert and oriented to person, place, and time. No cranial nerve deficit. Coordination normal.  The patient is alert and oriented 3. Cranial nerves are intact. Sensation is intact bilateral upper and lower arch remedies. No pronator drift. Finger to nose intact bilaterally. Speech is clear and coherent. EOMs are intact.  Skin: Skin is warm and dry. No rash noted. She is not diaphoretic. No erythema. No pallor.  Psychiatric: She has a normal mood and affect. Her behavior is normal.  Nursing note and vitals reviewed.   ED Course  Procedures (including critical care time) Labs Review Labs Reviewed  BASIC METABOLIC PANEL - Abnormal; Notable for the following:    Potassium 3.4 (*)    CO2 21 (*)    Glucose, Bld 108 (*)    BUN 5 (*)    All other components within normal limits  CBC - Abnormal;  Notable for the following:    WBC 12.6 (*)    All other components within normal limits  URINALYSIS, ROUTINE W REFLEX MICROSCOPIC (NOT AT [facility]) - Abnormal; Notable for the following:    Color, Urine AMBER (*)    APPearance TURBID (*)    Hgb urine dipstick MODERATE (*)    Bilirubin Urine SMALL (*)    Leukocytes, UA MODERATE (*)    All other components within normal limits  URINE MICROSCOPIC-ADD ON - Abnormal; Notable for the following:    Squamous Epithelial / LPF 6-30 (*)    Bacteria, UA MANY (*)    All other components within normal limits  CBG MONITORING, ED - Abnormal; Notable for the following:    Glucose-Capillary 107 (*)    All other components within normal limits  URINE CULTURE  D-DIMER, QUANTITATIVE (NOT AT [facility])  I-STAT BETA HCG BLOOD, ED ([facility], [facility], [facility] ONLY)  I-STAT TROPOININ, ED    Imaging Review Dg Chest 2 View  11/27/2015  CLINICAL DATA:  Acute onset of generalized chest tightness and syncope. Initial encounter. EXAM: CHEST  2 VIEW COMPARISON:  Chest radiograph from 08/31/2015 FINDINGS: The lungs are well-aerated and clear. There is no  evidence of focal opacification, pleural effusion or pneumothorax. The heart is normal in size; the mediastinal contour is within normal limits. No acute osseous abnormalities are seen. IMPRESSION: No acute cardiopulmonary process seen. Electronically Signed   By: Roanna Raider M.D.   On: 11/27/2015 19:35   I have personally reviewed and evaluated these images and lab results as part of my medical decision-making.   EKG Interpretation   Date/Time:  Thursday November 27 2015 19:08:45 EST Ventricular Rate:  77 PR Interval:  147 QRS Duration: 102 QT Interval:  385 QTC Calculation: 436 R Axis:   -12 Text Interpretation:  Sinus rhythm RSR' in V1 or V2, right VCD or RVH  Borderline T abnormalities, diffuse leads When compared with ECG of  11/23/2015, No significant change was found Confirmed by [facility]  MD, DAVID  ([id number]) on 11/27/2015  7:15:26 PM      Filed Vitals:   11/27/15 1900 11/27/15 1915 11/27/15 2100 11/27/15 2130  BP: 109/70 108/66 118/72 117/73  Pulse: 73 77 55 65  Resp: 17 14    SpO2: 99% 100% 98% 100%     MDM   Meds given in ED:  Medications  sodium chloride 0.9 % bolus 1,000 mL (0 mLs Intravenous Stopped 11/27/15 2131)    Discharge Medication List as of 11/27/2015  9:48 PM    START taking these medications   Details  cephALEXin (KEFLEX) 500 MG capsule Take 1 capsule (500 mg total) by mouth 2 (two) times daily., Starting 11/27/2015, Until Discontinued, Print        Final diagnoses:  UTI (lower urinary tract infection)  Syncope, unspecified syncope type  Dehydration   This s a 20 y.o. female who presents to the emergency department after syncopal episode after getting in the shower this morning. Patient reports she entered the shower and felt lightheaded, got out of the shower and sat on the toilet. She then passed out onto the ground. She reports hitting the back of her head. She reports she was able to get up immediately and went and found her boyfriend and then she came to the emergency department. She reports that after the syncopal episode she had 1-2 minutes of the chest pain, that has since resolved. She denies any shortness of breath. She reports she had palpitations earlier but this is resolved. She reports feeling lightheaded with position change. She also reports urinary frequency for the past week. No other urinary symptoms. The patient has been seen by cardiology in the past for palpitations and near-syncopal episodes. Dr. Herbie Baltimore wanted to do an event monitor but this has not been done. He suspected vasovagal syncope.  On exam the patient is afebrile nontoxic appearing. She has no focal neurological deficits. She reports been lightheaded with position change. Initially she has near orthostatic vital signs. She has no evidence of head trauma. No murmurs noted on exam. EKG shows no changes from  last tracing. Patient's d-dimer is negative. Troponin is negative. Is a negative pregnancy test. BMP shows potassium of 3.4 and is otherwise unremarkable. CBC shows a white count of 12,000. Stable hemoglobin. CXR is unremarkable. Urinalysis shows moderate leukocytes with many bacteria and too numerous to count white blood cells. Urine sent for culture. After fluid bolus patient reports feeling better and is no longer orthostatic. She denies feeling lightheaded with position change. I question if the patient became dehydrated from her urinary frequency due to UTI and she was dehydrated causing her to have a syncopal episode. I doubt cardiac  etiology at this time after her work up. I advised the patient of these findings. I still strongly encouraged her to follow-up with cardiology to have her exercise stress test. Will start the patient on Keflex and I encouraged her to drink 8 glasses of water per day. I advised the patient to follow-up with their primary care provider this week. I advised the patient to return to the emergency department with new or worsening symptoms or new concerns. The patient verbalized understanding and agreement with plan.    This patient was discussed with Dr. Preston Fleeting who agrees with assessment and plan.     Everlene Farrier, PA-C 11/28/15 0018  Dione Booze, MD 11/28/15 [phone]

## 2015-11-28 LAB — URINE CULTURE

## 2016-05-20 ENCOUNTER — Emergency Department (HOSPITAL_COMMUNITY): Payer: Self-pay

## 2016-05-20 ENCOUNTER — Encounter (HOSPITAL_COMMUNITY): Payer: Self-pay | Admitting: Emergency Medicine

## 2016-05-20 ENCOUNTER — Emergency Department (HOSPITAL_COMMUNITY)
Admission: EM | Admit: 2016-05-20 | Discharge: 2016-05-20 | Disposition: A | Discharge status: Home / Self Care | Payer: Self-pay | Attending: Emergency Medicine | Admitting: Emergency Medicine

## 2016-05-20 DIAGNOSIS — Z87891 Personal history of nicotine dependence: Secondary | ICD-10-CM | POA: Insufficient documentation

## 2016-05-20 DIAGNOSIS — R634 Abnormal weight loss: Secondary | ICD-10-CM | POA: Insufficient documentation

## 2016-05-20 DIAGNOSIS — Z79818 Long term (current) use of other agents affecting estrogen receptors and estrogen levels: Secondary | ICD-10-CM | POA: Insufficient documentation

## 2016-05-20 DIAGNOSIS — Q765 Cervical rib: Secondary | ICD-10-CM | POA: Insufficient documentation

## 2016-05-20 LAB — CBC WITH DIFFERENTIAL/PLATELET
BASOS PCT: 0 %
Basophils Absolute: 0 10*3/uL (ref 0.0–0.1)
Eosinophils Absolute: 0.3 10*3/uL (ref 0.0–0.7)
Eosinophils Relative: 5 %
HEMATOCRIT: 38.8 % (ref 36.0–46.0)
Hemoglobin: 12.9 g/dL (ref 12.0–15.0)
Lymphocytes Relative: 32 %
Lymphs Abs: 1.9 10*3/uL (ref 0.7–4.0)
MCH: 29.7 pg (ref 26.0–34.0)
MCHC: 33.2 g/dL (ref 30.0–36.0)
MCV: 89.2 fL (ref 78.0–100.0)
MONO ABS: 0.6 10*3/uL (ref 0.1–1.0)
MONOS PCT: 10 %
NEUTROS ABS: 3.1 10*3/uL (ref 1.7–7.7)
Neutrophils Relative %: 53 %
Platelets: 219 10*3/uL (ref 150–400)
RBC: 4.35 MIL/uL (ref 3.87–5.11)
RDW: 13.6 % (ref 11.5–15.5)
WBC: 5.8 10*3/uL (ref 4.0–10.5)

## 2016-05-20 LAB — COMPREHENSIVE METABOLIC PANEL
ALT: 12 U/L — ABNORMAL LOW (ref 14–54)
ANION GAP: 5 (ref 5–15)
AST: 16 U/L (ref 15–41)
Albumin: 4.2 g/dL (ref 3.5–5.0)
Alkaline Phosphatase: 41 U/L (ref 38–126)
BILIRUBIN TOTAL: 0.9 mg/dL (ref 0.3–1.2)
BUN: 8 mg/dL (ref 6–20)
CALCIUM: 9 mg/dL (ref 8.9–10.3)
CO2: 24 mmol/L (ref 22–32)
CREATININE: 0.57 mg/dL (ref 0.44–1.00)
Chloride: 108 mmol/L (ref 101–111)
Glucose, Bld: 89 mg/dL (ref 65–99)
POTASSIUM: 4.1 mmol/L (ref 3.5–5.1)
Sodium: 137 mmol/L (ref 135–145)
Total Protein: 6.8 g/dL (ref 6.5–8.1)

## 2016-05-20 LAB — I-STAT BETA HCG BLOOD, ED (MC, WL, AP ONLY): I-stat hCG, quantitative: <5 m[IU]/mL (ref <5)

## 2016-05-20 MED ORDER — ACETAMINOPHEN 325 MG PO TABS
650.0000 mg | ORAL_TABLET | Freq: Once | ORAL | Status: AC
  Administered 2016-05-20: 650 mg via ORAL
  Filled 2016-05-20: qty 2

## 2016-05-20 MED ORDER — IOPAMIDOL (ISOVUE-300) INJECTION 61%
75.0000 mL | Freq: Once | INTRAVENOUS | Status: AC | PRN
  Administered 2016-05-20: 75 mL via INTRAVENOUS

## 2016-05-20 NOTE — Discharge Instructions (Signed)
Follow up if needed

## 2016-05-20 NOTE — ED Notes (Signed)
[place] aware pt states "feel hot and like I'm going to pass out" with IV insertion. Pt placed on monitor and EKG obtained.

## 2016-05-20 NOTE — ED Notes (Signed)
Patient presents for lump to right sided of neck x2 weeks. Denies injury, difficulty swallowing, painful swallowing, HA, neck pain, fever. Patient reports weight loss of approximately 10 lbs in 2 weeks as well as decreased appetite.

## 2016-05-20 NOTE — Progress Notes (Signed)
EDCM spoke to patient at bedside. Patient confirms she does not [place] a pcp or [place].  [facility], informed patient of services there and walk in times.  EDCM also provided patient with list of pcps who accept self pay patients, list of discount pharmacies and websites needymeds.org and GoodRX.com for medication assistance, phone number to inquire about the orange card, phone number to inquire about Medicaid, phone number to inquire about the Affordable Care Act, financial resources in the community such as local churches, [employer], [employer], and dental assistance for uninsured patients.  Patient thankful for resources.  No further EDCM needs at this time.

## 2016-05-20 NOTE — ED Provider Notes (Signed)
CSN: [id number]     Arrival date & time 05/20/16  1147 History   First MD Initiated Contact with Patient 05/20/16 1411     Chief Complaint  Patient presents with  . Neck Pain  . Weight Loss     (Consider location/radiation/quality/duration/timing/severity/associated sxs/prior Treatment) Patient is a 20 y.o. female presenting with neck pain. The history is provided by the patient (Patient complains of a growth on the inside of her right neck.).  Neck Pain Pain location: Right lateral neck. Quality:  Aching Pain radiates to:  Does not radiate Pain severity:  Mild Pain is:  Same all the time Onset quality:  Sudden Timing:  Constant Associated symptoms: no chest pain and no headaches     Past Medical History  Diagnosis Date  . Heart murmur   . Near syncope   . Palpitations    History reviewed. No pertinent past surgical history. Family History  Problem Relation Age of Onset  . Depression Mother   . Heart attack Paternal Grandfather    Social History  Substance Use Topics  . Smoking status: Former Smoker    Types: Cigarettes  . Smokeless tobacco: None  . Alcohol Use: No   OB History    No data available     Review of Systems  Constitutional: Negative for appetite change and fatigue.  HENT: Negative for congestion, ear discharge and sinus pressure.        Growth to right-sided neck  Eyes: Negative for discharge.  Respiratory: Negative for cough.   Cardiovascular: Negative for chest pain.  Gastrointestinal: Negative for abdominal pain and diarrhea.  Genitourinary: Negative for frequency and hematuria.  Musculoskeletal: Positive for neck pain. Negative for back pain.  Skin: Negative for rash.  Neurological: Negative for seizures and headaches.  Psychiatric/Behavioral: Negative for hallucinations.      Allergies  Review of patient's allergies indicates no known allergies.  Home Medications   Prior to Admission medications   Medication Sig Start Date End  Date Taking? Authorizing Provider  etonogestrel (IMPLANON) 68 MG IMPL implant 1 each by Subdermal route once. April 2013   Yes Historical Provider, MD  cephALEXin (KEFLEX) 500 MG capsule Take 1 capsule (500 mg total) by mouth 2 (two) times daily. Patient not taking: Reported on 05/20/2016 11/27/15   Everlene Farrier, PA-C  predniSONE (DELTASONE) 20 MG tablet Take 2 tablets (40 mg total) by mouth daily. Take 1 tablet morning and night for 5 days, then 1 tablet each morning for 5 days Patient not taking: Reported on 05/20/2016 11/23/15   Roxy Horseman, PA-C   BP 117/73 mmHg  Pulse 58  Temp(Src) 98.7 F (37.1 C) (Oral)  Resp 16  Ht  (1.727 m)  Wt 121 lb 6 oz (55.055 kg)  BMI 18.46 kg/m2  SpO2 100%  LMP 05/15/2016 Physical Exam  Constitutional: She is oriented to person, place, and time. She appears well-developed.  HENT:  Head: Normocephalic.  Patient has an area that's about a half a centimeter that superior to her clavicle next to her neck feels little hard and is not similar to the other side of her neck.  Eyes: Conjunctivae and EOM are normal. No scleral icterus.  Neck: Neck supple. No thyromegaly present.  Cardiovascular: Normal rate and regular rhythm.  Exam reveals no gallop and no friction rub.   No murmur heard. Pulmonary/Chest: No stridor. She has no wheezes. She has no rales. She exhibits no tenderness.  Abdominal: She exhibits no distension. There is no tenderness.  There is no rebound.  Musculoskeletal: Normal range of motion. She exhibits no edema.  Lymphadenopathy:    She has no cervical adenopathy.  Neurological: She is oriented to person, place, and time. She exhibits normal muscle tone. Coordination normal.  Skin: No rash noted. No erythema.  Psychiatric: She has a normal mood and affect. Her behavior is normal.    ED Course  Procedures (including critical care time) Labs Review Labs Reviewed  COMPREHENSIVE METABOLIC PANEL - Abnormal; Notable for the following:     ALT 12 (*)    All other components within normal limits  CBC WITH DIFFERENTIAL/PLATELET  I-STAT BETA HCG BLOOD, ED ([facility], [facility], [facility] ONLY)    Imaging Review Ct Soft Tissue Neck W Contrast  05/20/2016  CLINICAL DATA:  20 year old female with palpable abnormality along the right neck for 2 weeks. Patient reports recent decreased appetite and weight loss, but denies pain or dysphagia. Initial encounter. EXAM: CT NECK WITH CONTRAST TECHNIQUE: Multidetector CT imaging of the neck was performed using the standard protocol following the bolus administration of intravenous contrast. CONTRAST:  75mL ISOVUE-300 IOPAMIDOL (ISOVUE-300) INJECTION 61% COMPARISON:  Brain MRI 09/02/2004. FINDINGS: Pharynx and larynx: Larynx and pharynx soft tissue contours are within normal limits. Negative parapharyngeal and retropharyngeal spaces. Salivary glands: Sublingual space, submandibular glands, and parotid glands are within normal limits. Thyroid: Negative. Lymph nodes: It appears a skin marker was placed along the right lateral lower neck as seen on series 3, image 67 (coronal image 53). The marker overlies the normal right external jugular vein. No soft tissue mass or lymphadenopathy is identified in the region. There is a nearby normal (Very small 2-3 mm lymph node as seen on series 3, image 62. The right external jugular vein appears patent and is enhancing. Other vascular findings are described below. See also skeletal findings described below. Larger lymph nodes are present at the bilateral level 2 stations as expected. Overall cervical nodes appear symmetric and normal for age. No cystic or necrotic nodes. Vascular: Major vascular structures in the neck and at the skullbase are patent, including the right internal jugular and external jugular veins. No atherosclerosis. Limited intracranial: Negative. Visualized orbits: Negative. Mastoids and visualized paranasal sinuses: Very mild right ethmoid and maxillary sinus mucosal  thickening, otherwise clear. Skeleton: No osseous abnormality identified. There are bilateral C7 cervical ribs, and that on the right is somewhat more prominent. The left C7 rib fuses with the left first rib. The right C7 rib has an articulation with the lateral right first rib as seen on series 602, image 17 - and this is also in proximity to the palpable area of concern. Upper chest: Negative visualized superior mediastinum. Negative lung apices (tiny calcified granuloma in the right apex on series 5, image 25). IMPRESSION: 1. No neck mass or lymphadenopathy to correspond to the palpable area. I feel the patient may be palpating an aberrant right C7-first rib articulation (as seen on series 602, image 17), or possibly the normal right external jugular vein. 2. C7 cervical ribs are a normal anatomic variation. A left C7 cervical rib is also present in this patient, and fuses with the left first rib. 3. Otherwise negative neck CT. Electronically Signed   By: Odessa Fleming M.D.   On: 05/20/2016 17:09   I have personally reviewed and evaluated these images and lab results as part of my medical decision-making.   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 The radiologist feels like this is the mass the patient is complaining of. The patient was reassured that there is nothing new about this and she should be fine to follow-up as needed    Bethann BerkshireJoseph Daimon Kean, MD 05/20/16 1738

## 2016-05-20 NOTE — Progress Notes (Signed)
CM spoke with pt who confirms uninsured [place] resident with [place] pcp.  CM discussed and provided written information to assist pt with determining choice for uninsured accepting pcps, discussed the importance of pcp vs EDP services for f/u care, www.needymeds.org, www.goodrx.com, discounted pharmacies and other [place] resources such as [facility] , [facility], affordable care act, financial assistance, uninsured dental services, [place] med assist, DSS and  health department  Reviewed resources for [place] uninsured accepting pcps like [facility], [facility] [address], [facility], [facility], local urgent care centers, [facility], [facility], general medical clinics, [facility], [facility] plus others, medication resources, [facility] out patient [facility]ies and housing Pt voiced understanding and appreciation of resources provided   Provided [facility] contact information 

## 2016-05-20 NOTE — ED Notes (Signed)
MD at bedside. Dr. Estell HarpinZammit at bedside.

## 2017-03-07 ENCOUNTER — Encounter (HOSPITAL_COMMUNITY): Payer: Self-pay

## 2017-03-07 ENCOUNTER — Ambulatory Visit: Payer: Self-pay | Admitting: Obstetrics and Gynecology

## 2017-03-07 ENCOUNTER — Emergency Department (HOSPITAL_COMMUNITY)
Admission: EM | Admit: 2017-03-07 | Discharge: 2017-03-07 | Disposition: A | Discharge status: Home / Self Care | Payer: Self-pay | Attending: Emergency Medicine | Admitting: Emergency Medicine

## 2017-03-07 DIAGNOSIS — Z87891 Personal history of nicotine dependence: Secondary | ICD-10-CM | POA: Insufficient documentation

## 2017-03-07 DIAGNOSIS — Z3046 Encounter for surveillance of implantable subdermal contraceptive: Secondary | ICD-10-CM

## 2017-03-07 DIAGNOSIS — M79602 Pain in left arm: Secondary | ICD-10-CM | POA: Insufficient documentation

## 2017-03-07 DIAGNOSIS — Z3049 Encounter for surveillance of other contraceptives: Secondary | ICD-10-CM | POA: Insufficient documentation

## 2017-03-07 NOTE — Discharge Instructions (Signed)
Please call the [facility] to schedule an appointment for the removal of your contraception device.

## 2017-03-07 NOTE — ED Provider Notes (Signed)
[facility] Provider Note   CSN: [id number] Arrival date & time: 03/07/17  1517 By signing my name below, I, Bridgette Habermann, attest that this documentation has been prepared under the direction and in the presence of Felicie Morn, FNP. Electronically Signed: Bridgette Habermann, ED Scribe. 03/07/17. 5:37 PM.  History   Chief Complaint Chief Complaint  Patient presents with  . Contraception    HPI The history is provided by the patient. No language interpreter was used.   HPI Comments: Misty Guerra is a 21 y.o. female with no pertinent PMHx, who presents to the Emergency Department complaining of left arm pain beginning several days ago. Pt states pain is burning in quality. She reports that she had a contraceptive placed (Implanon 68 mg) in the area in 2013; she was suppose to get it removed in 2015 but has not done so. Pt denies fever, chills, nausea, vomiting, or any other associated symptoms. No additional complaints at this time.   Past Medical History:  Diagnosis Date  . Heart murmur   . Near syncope   . Palpitations     Patient Active Problem List   Diagnosis Date Noted  . Chest pain with low risk for cardiac etiology 10/15/2015  . Syncope 06/13/2015  . Orthostatic hypotension 06/13/2015  . Cardiac murmur 06/13/2015  . Palpitations 06/13/2015    History reviewed. No pertinent surgical history.  OB History    No data available       Home Medications    Prior to Admission medications   Medication Sig Start Date End Date Taking? Authorizing Provider  cephALEXin (KEFLEX) 500 MG capsule Take 1 capsule (500 mg total) by mouth 2 (two) times daily. Patient not taking: Reported on 05/20/2016 11/27/15   Everlene Farrier, PA-C  etonogestrel (IMPLANON) 68 MG IMPL implant 1 each by Subdermal route once. April 2013    Historical Provider, MD  predniSONE (DELTASONE) 20 MG tablet Take 2 tablets (40 mg total) by mouth daily. Take 1 tablet morning and night for 5 days, then 1 tablet  each morning for 5 days Patient not taking: Reported on 05/20/2016 11/23/15   Roxy Horseman, PA-C    Family History Family History  Problem Relation Age of Onset  . Depression Mother   . Heart attack Paternal Grandfather     Social History Social History  Substance Use Topics  . Smoking status: Former Smoker    Types: Cigarettes  . Smokeless tobacco: Never Used  . Alcohol use No     Allergies   Patient has no known allergies.   Review of Systems Review of Systems  Constitutional: Negative for chills and fever.  Gastrointestinal: Negative for nausea and vomiting.  Musculoskeletal: Positive for myalgias.  All other systems reviewed and are negative.  Physical Exam Updated Vital Signs BP 130/83 (BP Location: Right Arm)   Pulse 78   Temp 98.6 F (37 C) (Oral)   Resp 18   Ht  (1.727 m)   Wt 135 lb (61.2 kg)   SpO2 100%   BMI 20.53 kg/m   Physical Exam  Constitutional: She appears well-developed and well-nourished.  HENT:  Head: Normocephalic.  Eyes: Conjunctivae are normal.  Cardiovascular: Normal rate, regular rhythm and normal heart sounds.  Exam reveals no gallop.   No murmur heard. Pulmonary/Chest: Effort normal. No respiratory distress.  Abdominal: She exhibits no distension.  Musculoskeletal: Normal range of motion.  Contraceptive implant at the right upper aspect of inner left arm, 2 inches in length.  Mild tenderness overlying the area. No erythema or increased warmth.  Neurological: She is alert.  Skin: Skin is warm and dry.  Psychiatric: She has a normal mood and affect. Her behavior is normal.  Nursing note and vitals reviewed.    ED Treatments / Results  DIAGNOSTIC STUDIES: Oxygen Saturation is 100% on RA, normal by my interpretation.   COORDINATION OF CARE: 5:22 PM-Discussed next steps with pt. Pt verbalized understanding and is agreeable with the plan.   Labs (all labs ordered are listed, but only abnormal results are  displayed) Labs Reviewed - No data to display  EKG  EKG Interpretation None       Radiology No results found.  Procedures Procedures (including critical care time)  Medications Ordered in ED Medications - No data to display   Initial Impression / Assessment and Plan / ED Course  I have reviewed the triage vital signs and the nursing notes.  Pertinent labs & imaging results that were available during my care of the patient were reviewed by me and considered in my medical decision making (see chart for details).     Patient with implanon subdermal implant that was placed in 2013.  After discussion with Dr. Rubin Payor, patient will be referred to the [facility] for removal. Contact information for the clinic provided.  Final Clinical Impressions(s) / ED Diagnoses   Final diagnoses:  Encounter for removal of subdermal contraceptive implant    New Prescriptions New Prescriptions   No medications on file   I personally performed the services described in this documentation, which was scribed in my presence. The recorded information has been reviewed and is accurate.     Felicie Morn, NP 03/07/17 1842    Benjiman Core, MD 03/08/17 2108

## 2017-03-07 NOTE — ED Triage Notes (Signed)
Per Pt, Pt had contraceptive put in her left arm and would like to have it removed. Reports pain now at the site.

## 2017-03-29 ENCOUNTER — Emergency Department (HOSPITAL_COMMUNITY)
Admission: EM | Admit: 2017-03-29 | Discharge: 2017-03-29 | Disposition: A | Discharge status: Home / Self Care | Payer: No Typology Code available for payment source | Attending: Emergency Medicine | Admitting: Emergency Medicine

## 2017-03-29 ENCOUNTER — Encounter (HOSPITAL_COMMUNITY): Payer: Self-pay | Admitting: Emergency Medicine

## 2017-03-29 ENCOUNTER — Emergency Department (HOSPITAL_COMMUNITY): Payer: No Typology Code available for payment source

## 2017-03-29 DIAGNOSIS — M7918 Myalgia, other site: Secondary | ICD-10-CM

## 2017-03-29 DIAGNOSIS — Z87891 Personal history of nicotine dependence: Secondary | ICD-10-CM | POA: Diagnosis not present

## 2017-03-29 DIAGNOSIS — Y999 Unspecified external cause status: Secondary | ICD-10-CM | POA: Diagnosis not present

## 2017-03-29 DIAGNOSIS — Z79899 Other long term (current) drug therapy: Secondary | ICD-10-CM | POA: Insufficient documentation

## 2017-03-29 DIAGNOSIS — Y92410 Unspecified street and highway as the place of occurrence of the external cause: Secondary | ICD-10-CM | POA: Diagnosis not present

## 2017-03-29 DIAGNOSIS — S161XXA Strain of muscle, fascia and tendon at neck level, initial encounter: Secondary | ICD-10-CM | POA: Diagnosis not present

## 2017-03-29 DIAGNOSIS — Y939 Activity, unspecified: Secondary | ICD-10-CM | POA: Diagnosis not present

## 2017-03-29 DIAGNOSIS — S199XXA Unspecified injury of neck, initial encounter: Secondary | ICD-10-CM | POA: Diagnosis present

## 2017-03-29 LAB — POC URINE PREG, ED: Preg Test, Ur: NEGATIVE

## 2017-03-29 MED ORDER — METHOCARBAMOL 500 MG PO TABS
500.0000 mg | ORAL_TABLET | Freq: Once | ORAL | Status: AC
  Administered 2017-03-29: 500 mg via ORAL
  Filled 2017-03-29: qty 1

## 2017-03-29 MED ORDER — IBUPROFEN 600 MG PO TABS: 600.0000 mg | ORAL_TABLET | Freq: Four times a day (QID) | ORAL | 0 refills | Status: AC | PRN

## 2017-03-29 MED ORDER — METHOCARBAMOL 500 MG PO TABS: 500.0000 mg | ORAL_TABLET | Freq: Two times a day (BID) | ORAL | 0 refills | Status: AC

## 2017-03-29 MED ORDER — IBUPROFEN 200 MG PO TABS
600.0000 mg | ORAL_TABLET | Freq: Once | ORAL | Status: AC
  Administered 2017-03-29: 600 mg via ORAL
  Filled 2017-03-29: qty 3
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
## 2017-04-06 ENCOUNTER — Encounter (HOSPITAL_COMMUNITY): Payer: Self-pay | Admitting: Emergency Medicine

## 2017-04-06 DIAGNOSIS — Z87891 Personal history of nicotine dependence: Secondary | ICD-10-CM | POA: Insufficient documentation

## 2017-04-06 DIAGNOSIS — Z79899 Other long term (current) drug therapy: Secondary | ICD-10-CM | POA: Diagnosis not present

## 2017-04-06 DIAGNOSIS — R55 Syncope and collapse: Secondary | ICD-10-CM | POA: Insufficient documentation

## 2017-04-06 LAB — BASIC METABOLIC PANEL
Anion gap: 6 (ref 5–15)
BUN: 8 mg/dL (ref 6–20)
CHLORIDE: 104 mmol/L (ref 101–111)
CO2: 25 mmol/L (ref 22–32)
Calcium: 8.8 mg/dL — ABNORMAL LOW (ref 8.9–10.3)
Creatinine, Ser: 0.69 mg/dL (ref 0.44–1.00)
GFR calc Af Amer: >60 mL/min (ref >60)
GFR calc non Af Amer: >60 mL/min (ref >60)
GLUCOSE: 103 mg/dL — AB (ref 65–99)
POTASSIUM: 3.9 mmol/L (ref 3.5–5.1)
Sodium: 135 mmol/L (ref 135–145)

## 2017-04-06 LAB — CBG MONITORING, ED: Glucose-Capillary: 100 mg/dL — ABNORMAL HIGH (ref 65–99)

## 2017-04-06 LAB — CBC
HEMATOCRIT: 37.7 % (ref 36.0–46.0)
Hemoglobin: 11.8 g/dL — ABNORMAL LOW (ref 12.0–15.0)
MCH: 27.3 pg (ref 26.0–34.0)
MCHC: 31.3 g/dL (ref 30.0–36.0)
MCV: 87.1 fL (ref 78.0–100.0)
Platelets: 273 10*3/uL (ref 150–400)
RBC: 4.33 MIL/uL (ref 3.87–5.11)
RDW: 14.7 % (ref 11.5–15.5)
WBC: 6.1 10*3/uL (ref 4.0–10.5)

## 2017-04-06 NOTE — ED Notes (Signed)
Pt's visitor states [place]. She did have a negative head CT at that time.
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
## 2017-04-06 NOTE — ED Notes (Signed)
Checked [facility] RN Autumn informed

## 2017-04-07 ENCOUNTER — Emergency Department (HOSPITAL_COMMUNITY): Payer: No Typology Code available for payment source

## 2017-04-07 ENCOUNTER — Emergency Department (HOSPITAL_COMMUNITY)
Admission: EM | Admit: 2017-04-07 | Discharge: 2017-04-07 | Disposition: A | Discharge status: Home / Self Care | Payer: No Typology Code available for payment source | Attending: Emergency Medicine | Admitting: Emergency Medicine

## 2017-04-07 DIAGNOSIS — R55 Syncope and collapse: Secondary | ICD-10-CM

## 2017-04-07 LAB — URINALYSIS, ROUTINE W REFLEX MICROSCOPIC
Bilirubin Urine: NEGATIVE
Glucose, UA: NEGATIVE mg/dL
KETONES UR: NEGATIVE mg/dL
LEUKOCYTES UA: NEGATIVE
NITRITE: NEGATIVE
PROTEIN: NEGATIVE mg/dL
Specific Gravity, Urine: 1.02 (ref 1.005–1.030)
pH: 6.5 (ref 5.0–8.0)

## 2017-04-07 LAB — URINALYSIS, MICROSCOPIC (REFLEX)

## 2017-04-07 LAB — PREGNANCY, URINE: PREG TEST UR: NEGATIVE

## 2017-04-07 MED ORDER — DIPHENHYDRAMINE HCL 50 MG/ML IJ SOLN
25.0000 mg | Freq: Once | INTRAMUSCULAR | Status: AC
Start: 2017-04-07 — End: 2017-04-07
  Administered 2017-04-07: 25 mg via INTRAVENOUS
  Filled 2017-04-07: qty 1

## 2017-04-07 MED ORDER — SODIUM CHLORIDE 0.9 % IV BOLUS (SEPSIS)
1000.0000 mL | Freq: Once | INTRAVENOUS | Status: AC
  Administered 2017-04-07: 1000 mL via INTRAVENOUS

## 2017-04-07 MED ORDER — KETOROLAC TROMETHAMINE 30 MG/ML IJ SOLN
30.0000 mg | Freq: Once | INTRAMUSCULAR | Status: AC
  Administered 2017-04-07: 30 mg via INTRAVENOUS
  Filled 2017-04-07: qty 1

## 2017-04-07 MED ORDER — METOCLOPRAMIDE HCL 5 MG/ML IJ SOLN
10.0000 mg | Freq: Once | INTRAMUSCULAR | Status: AC
  Administered 2017-04-07: 10 mg via INTRAVENOUS
  Filled 2017-04-07: qty 2
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
## 2017-04-07 NOTE — Discharge Instructions (Signed)
Keep yourself hydrated.  Follow-up with your doctor.  Return to the ED if you develop new or worsening symptoms. °

## 2017-04-07 NOTE — ED Notes (Signed)
Family at bedside. 
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
